# Patient Record
Sex: Female | Born: 1990
Health system: Southern US, Community
[De-identification: ages and names within clinical notes are randomized; demographics above are authoritative.]

## PROBLEM LIST (undated history)

## (undated) DIAGNOSIS — K08409 Partial loss of teeth, unspecified cause, unspecified class: Secondary | ICD-10-CM

## (undated) DIAGNOSIS — Z9889 Other specified postprocedural states: Secondary | ICD-10-CM

## (undated) DIAGNOSIS — Z8619 Personal history of other infectious and parasitic diseases: Secondary | ICD-10-CM

## (undated) DIAGNOSIS — D649 Anemia, unspecified: Secondary | ICD-10-CM

## (undated) DIAGNOSIS — I10 Essential (primary) hypertension: Secondary | ICD-10-CM

## (undated) DIAGNOSIS — Z8719 Personal history of other diseases of the digestive system: Secondary | ICD-10-CM

## (undated) HISTORY — DX: Personal history of other infectious and parasitic diseases: Z86.19

## (undated) HISTORY — DX: Morbid (severe) obesity due to excess calories: E66.01

---
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
## 2009-04-30 ENCOUNTER — Other Ambulatory Visit: Admission: RE | Admit: 2009-04-30 | Discharge: 2009-04-30 | Payer: Self-pay | Admitting: Family Medicine

## 2009-07-29 ENCOUNTER — Emergency Department (HOSPITAL_COMMUNITY): Admission: EM | Admit: 2009-07-29 | Discharge: 2009-07-29 | Payer: Self-pay | Admitting: Emergency Medicine

## 2010-02-26 ENCOUNTER — Inpatient Hospital Stay (HOSPITAL_COMMUNITY): Admission: AD | Admit: 2010-02-26 | Discharge: 2010-02-26 | Payer: Self-pay | Admitting: Obstetrics and Gynecology

## 2010-02-26 ENCOUNTER — Ambulatory Visit: Payer: Self-pay | Admitting: Physician Assistant

## 2010-02-27 ENCOUNTER — Inpatient Hospital Stay (HOSPITAL_COMMUNITY): Admission: AD | Admit: 2010-02-27 | Discharge: 2010-02-27 | Payer: Self-pay | Admitting: Obstetrics and Gynecology

## 2010-03-01 ENCOUNTER — Inpatient Hospital Stay (HOSPITAL_COMMUNITY): Admission: AD | Admit: 2010-03-01 | Discharge: 2010-03-02 | Payer: Self-pay | Admitting: Obstetrics & Gynecology

## 2010-03-01 ENCOUNTER — Ambulatory Visit: Payer: Self-pay | Admitting: Nurse Practitioner

## 2010-03-03 ENCOUNTER — Inpatient Hospital Stay (HOSPITAL_COMMUNITY): Admission: AD | Admit: 2010-03-03 | Discharge: 2010-03-03 | Payer: Self-pay | Admitting: Obstetrics and Gynecology

## 2010-03-03 ENCOUNTER — Ambulatory Visit: Payer: Self-pay | Admitting: Obstetrics and Gynecology

## 2010-07-03 ENCOUNTER — Inpatient Hospital Stay (HOSPITAL_COMMUNITY)
Admission: AD | Admit: 2010-07-03 | Discharge: 2010-07-03 | Payer: Self-pay | Source: Home / Self Care | Attending: Obstetrics and Gynecology | Admitting: Obstetrics and Gynecology

## 2010-07-12 ENCOUNTER — Inpatient Hospital Stay (HOSPITAL_COMMUNITY)
Admission: AD | Admit: 2010-07-12 | Discharge: 2010-07-12 | Payer: Self-pay | Source: Home / Self Care | Attending: Obstetrics and Gynecology | Admitting: Obstetrics and Gynecology

## 2010-07-12 DIAGNOSIS — O99891 Other specified diseases and conditions complicating pregnancy: Secondary | ICD-10-CM

## 2010-07-12 DIAGNOSIS — R109 Unspecified abdominal pain: Secondary | ICD-10-CM

## 2010-07-12 DIAGNOSIS — O9989 Other specified diseases and conditions complicating pregnancy, childbirth and the puerperium: Secondary | ICD-10-CM

## 2010-07-17 NOTE — L&D Delivery Note (Signed)
Delivery Note At 1:15 AM a viable female was delivered via  Spontaneous vaginal delivery).  APGAR:8/9 , ; weight . pending  Placenta status:intact with 3 vessel cord , .  Cord UJ:WJXB  Anesthesia:  epidural Episiotomy:none  Lacerations: none  Est. Blood Loss (mL): 400 cc  Mom to postpartum.  Baby to nursery-stable.  Gloria Chase S 02/18/2011, 1:24 AM

## 2010-07-29 LAB — HIV ANTIBODY (ROUTINE TESTING W REFLEX): HIV: NONREACTIVE

## 2010-07-29 LAB — RUBELLA ANTIBODY, IGM: Rubella: IMMUNE

## 2010-07-29 LAB — TYPE AND SCREEN: Antibody Screen: NEGATIVE

## 2010-07-29 LAB — STREP B DNA PROBE: GBS: POSITIVE

## 2010-07-29 LAB — SYPHILIS: RPR W/REFLEX TO RPR TITER AND TREPONEMAL ANTIBODIES, TRADITIONAL SCREENING AND DIAGNOSIS ALGORITHM: RPR: NONREACTIVE

## 2010-09-21 ENCOUNTER — Inpatient Hospital Stay (HOSPITAL_COMMUNITY)
Admission: AD | Admit: 2010-09-21 | Discharge: 2010-09-21 | Disposition: A | Discharge status: Home / Self Care | Payer: Medicaid Other | Source: Ambulatory Visit | Attending: Obstetrics and Gynecology | Admitting: Obstetrics and Gynecology

## 2010-09-21 DIAGNOSIS — O9989 Other specified diseases and conditions complicating pregnancy, childbirth and the puerperium: Secondary | ICD-10-CM

## 2010-09-21 DIAGNOSIS — E860 Dehydration: Secondary | ICD-10-CM

## 2010-09-21 DIAGNOSIS — R109 Unspecified abdominal pain: Secondary | ICD-10-CM | POA: Insufficient documentation

## 2010-09-21 DIAGNOSIS — O99891 Other specified diseases and conditions complicating pregnancy: Secondary | ICD-10-CM

## 2010-09-21 LAB — COMPREHENSIVE METABOLIC PANEL
ALT: 10 U/L (ref 0–35)
Alkaline Phosphatase: 54 U/L (ref 39–117)
BUN: 5 mg/dL — ABNORMAL LOW (ref 6–23)
CO2: 25 mEq/L (ref 19–32)
Calcium: 9 mg/dL (ref 8.4–10.5)
GFR calc non Af Amer: >60 mL/min (ref >60)
Glucose, Bld: 85 mg/dL (ref 70–99)
Sodium: 137 mEq/L (ref 135–145)
Total Protein: 6.5 g/dL (ref 6.0–8.3)

## 2010-09-21 LAB — CBC
HCT: 36.1 % (ref 36.0–46.0)
Hemoglobin: 11.5 g/dL — ABNORMAL LOW (ref 12.0–15.0)
MCHC: 31.9 g/dL (ref 30.0–36.0)
MCV: 84.9 fL (ref 78.0–100.0)
RDW: 13.8 % (ref 11.5–15.5)

## 2010-09-21 LAB — URINALYSIS, ROUTINE W REFLEX MICROSCOPIC
Ketones, ur: >80 mg/dL — AB
Nitrite: NEGATIVE
Protein, ur: NEGATIVE mg/dL
Urobilinogen, UA: 1 mg/dL (ref 0.0–1.0)

## 2010-09-21 LAB — WET PREP, GENITAL: Yeast Wet Prep HPF POC: NONE SEEN

## 2010-09-22 LAB — URINE CULTURE: Culture  Setup Time: 201203080120

## 2010-09-22 LAB — GC/CHLAMYDIA PROBE AMP, GENITAL
Chlamydia, DNA Probe: NEGATIVE
GC Probe Amp, Genital: NEGATIVE

## 2010-09-26 LAB — URINALYSIS, ROUTINE W REFLEX MICROSCOPIC
Bilirubin Urine: NEGATIVE
Glucose, UA: NEGATIVE mg/dL
Glucose, UA: NEGATIVE mg/dL
Hgb urine dipstick: NEGATIVE
Nitrite: NEGATIVE
Nitrite: NEGATIVE
Specific Gravity, Urine: 1.015 (ref 1.005–1.030)
Specific Gravity, Urine: >1.03 — ABNORMAL HIGH (ref 1.005–1.030)
pH: 6 (ref 5.0–8.0)
pH: 7 (ref 5.0–8.0)

## 2010-09-26 LAB — CBC
HCT: 39.3 % (ref 36.0–46.0)
Hemoglobin: 12.9 g/dL (ref 12.0–15.0)
Hemoglobin: 13.2 g/dL (ref 12.0–15.0)
MCH: 27.2 pg (ref 26.0–34.0)
Platelets: 265 10*3/uL (ref 150–400)
RBC: 4.74 MIL/uL (ref 3.87–5.11)
RBC: 4.91 MIL/uL (ref 3.87–5.11)
WBC: 7.4 10*3/uL (ref 4.0–10.5)

## 2010-09-26 LAB — WET PREP, GENITAL
Clue Cells Wet Prep HPF POC: NONE SEEN
Trich, Wet Prep: NONE SEEN
Trich, Wet Prep: NONE SEEN
Yeast Wet Prep HPF POC: NONE SEEN

## 2010-09-26 LAB — HCG, QUANTITATIVE, PREGNANCY
hCG, Beta Chain, Quant, S: 28145 m[IU]/mL — ABNORMAL HIGH (ref <5)
hCG, Beta Chain, Quant, S: 3772 m[IU]/mL — ABNORMAL HIGH (ref <5)

## 2010-09-26 LAB — POCT PREGNANCY, URINE
Preg Test, Ur: POSITIVE
Preg Test, Ur: POSITIVE

## 2010-09-26 LAB — GC/CHLAMYDIA PROBE AMP, GENITAL: Chlamydia, DNA Probe: NEGATIVE

## 2010-09-30 LAB — URINALYSIS, ROUTINE W REFLEX MICROSCOPIC
Bilirubin Urine: NEGATIVE
Glucose, UA: NEGATIVE mg/dL
Ketones, ur: 15 mg/dL — AB
Ketones, ur: NEGATIVE mg/dL
Leukocytes, UA: NEGATIVE
Nitrite: NEGATIVE
Protein, ur: 100 mg/dL — AB
pH: 6 (ref 5.0–8.0)

## 2010-09-30 LAB — ABO/RH: ABO/RH(D): B POS

## 2010-09-30 LAB — COMPREHENSIVE METABOLIC PANEL
ALT: 10 U/L (ref 0–35)
Alkaline Phosphatase: 58 U/L (ref 39–117)
CO2: 25 mEq/L (ref 19–32)
Calcium: 8.7 mg/dL (ref 8.4–10.5)
GFR calc non Af Amer: >60 mL/min (ref >60)
Glucose, Bld: 96 mg/dL (ref 70–99)
Potassium: 3.7 mEq/L (ref 3.5–5.1)
Sodium: 140 mEq/L (ref 135–145)

## 2010-09-30 LAB — CBC
Hemoglobin: 12.7 g/dL (ref 12.0–15.0)
MCHC: 32.4 g/dL (ref 30.0–36.0)
RBC: 4.66 MIL/uL (ref 3.87–5.11)

## 2010-09-30 LAB — GC/CHLAMYDIA PROBE AMP, GENITAL: GC Probe Amp, Genital: NEGATIVE

## 2010-09-30 LAB — WET PREP, GENITAL: Trich, Wet Prep: NONE SEEN

## 2010-09-30 LAB — URINE MICROSCOPIC-ADD ON

## 2010-09-30 LAB — POCT PREGNANCY, URINE: Preg Test, Ur: POSITIVE

## 2010-10-15 ENCOUNTER — Inpatient Hospital Stay (HOSPITAL_COMMUNITY)
Admission: AD | Admit: 2010-10-15 | Discharge: 2010-10-15 | Disposition: A | Discharge status: Home / Self Care | Payer: Medicaid Other | Source: Ambulatory Visit | Attending: Obstetrics and Gynecology | Admitting: Obstetrics and Gynecology

## 2010-10-15 DIAGNOSIS — R1032 Left lower quadrant pain: Secondary | ICD-10-CM

## 2010-10-15 DIAGNOSIS — A499 Bacterial infection, unspecified: Secondary | ICD-10-CM | POA: Insufficient documentation

## 2010-10-15 DIAGNOSIS — B9689 Other specified bacterial agents as the cause of diseases classified elsewhere: Secondary | ICD-10-CM | POA: Insufficient documentation

## 2010-10-15 DIAGNOSIS — O2390 Unspecified genitourinary tract infection in pregnancy, unspecified trimester: Secondary | ICD-10-CM

## 2010-10-15 DIAGNOSIS — N760 Acute vaginitis: Secondary | ICD-10-CM | POA: Insufficient documentation

## 2010-10-15 LAB — URINALYSIS, ROUTINE W REFLEX MICROSCOPIC
Glucose, UA: NEGATIVE mg/dL
Ketones, ur: 15 mg/dL — AB
Specific Gravity, Urine: 1.025 (ref 1.005–1.030)
pH: 6.5 (ref 5.0–8.0)

## 2010-10-15 LAB — WET PREP, GENITAL
Trich, Wet Prep: NONE SEEN
Yeast Wet Prep HPF POC: NONE SEEN

## 2010-12-16 ENCOUNTER — Inpatient Hospital Stay (HOSPITAL_COMMUNITY)
Admission: AD | Admit: 2010-12-16 | Discharge: 2010-12-16 | Disposition: A | Discharge status: Home / Self Care | Payer: Medicaid Other | Source: Ambulatory Visit | Attending: Obstetrics and Gynecology | Admitting: Obstetrics and Gynecology

## 2010-12-16 DIAGNOSIS — G43909 Migraine, unspecified, not intractable, without status migrainosus: Secondary | ICD-10-CM | POA: Insufficient documentation

## 2010-12-16 DIAGNOSIS — O4700 False labor before 37 completed weeks of gestation, unspecified trimester: Secondary | ICD-10-CM

## 2010-12-16 LAB — URINALYSIS, ROUTINE W REFLEX MICROSCOPIC
Bilirubin Urine: NEGATIVE
Glucose, UA: NEGATIVE mg/dL
Hgb urine dipstick: NEGATIVE
Ketones, ur: 15 mg/dL — AB
Protein, ur: NEGATIVE mg/dL

## 2011-01-11 DIAGNOSIS — Z9889 Other specified postprocedural states: Secondary | ICD-10-CM

## 2011-01-11 HISTORY — DX: Other specified postprocedural states: Z98.890

## 2011-02-17 ENCOUNTER — Inpatient Hospital Stay (HOSPITAL_COMMUNITY)
Admission: AD | Admit: 2011-02-17 | Discharge: 2011-02-19 | DRG: 775 | Disposition: A | Discharge status: Home / Self Care | Payer: Medicaid Other | Source: Ambulatory Visit | Attending: Obstetrics and Gynecology | Admitting: Obstetrics and Gynecology

## 2011-02-17 ENCOUNTER — Encounter (HOSPITAL_COMMUNITY): Payer: Self-pay | Admitting: Anesthesiology

## 2011-02-17 ENCOUNTER — Inpatient Hospital Stay (HOSPITAL_COMMUNITY): Payer: Medicaid Other | Admitting: Anesthesiology

## 2011-02-17 ENCOUNTER — Encounter (HOSPITAL_COMMUNITY): Payer: Self-pay | Admitting: *Deleted

## 2011-02-17 DIAGNOSIS — Z331 Pregnant state, incidental: Secondary | ICD-10-CM

## 2011-02-17 HISTORY — DX: Anemia, unspecified: D64.9

## 2011-02-17 HISTORY — DX: Personal history of other diseases of the digestive system: Z87.19

## 2011-02-17 HISTORY — DX: Other specified postprocedural states: Z98.890

## 2011-02-17 HISTORY — DX: Partial loss of teeth, unspecified cause, unspecified class: K08.409

## 2011-02-17 LAB — CBC
HCT: 35.1 % — ABNORMAL LOW (ref 36.0–46.0)
Hemoglobin: 11.3 g/dL — ABNORMAL LOW (ref 12.0–15.0)
MCH: 28.2 pg (ref 26.0–34.0)
MCHC: 32.2 g/dL (ref 30.0–36.0)
RBC: 4.01 MIL/uL (ref 3.87–5.11)

## 2011-02-17 MED ORDER — LACTATED RINGERS IV SOLN
500.0000 mL | Freq: Once | INTRAVENOUS | Status: AC
  Administered 2011-02-17: 1000 mL via INTRAVENOUS

## 2011-02-17 MED ORDER — OXYCODONE-ACETAMINOPHEN 5-325 MG PO TABS
2.0000 | ORAL_TABLET | ORAL | Status: DC | PRN
  Administered 2011-02-18: 2 via ORAL
  Filled 2011-02-17: qty 2

## 2011-02-17 MED ORDER — LACTATED RINGERS IV SOLN
INTRAVENOUS | Status: DC
  Administered 2011-02-17 (×2): via INTRAVENOUS

## 2011-02-17 MED ORDER — OXYTOCIN 20 UNITS IN LACTATED RINGERS INFUSION - SIMPLE
1.0000 m[IU]/min | INTRAVENOUS | Status: DC
  Administered 2011-02-17: 6 m[IU]/min via INTRAVENOUS

## 2011-02-17 MED ORDER — ONDANSETRON HCL 4 MG/2ML IJ SOLN
4.0000 mg | Freq: Four times a day (QID) | INTRAMUSCULAR | Status: DC | PRN
  Administered 2011-02-17: 4 mg via INTRAVENOUS
  Filled 2011-02-17: qty 2

## 2011-02-17 MED ORDER — IBUPROFEN 600 MG PO TABS: 600.0000 mg | ORAL_TABLET | Freq: Four times a day (QID) | ORAL | Status: DC | PRN

## 2011-02-17 MED ORDER — FENTANYL 2.5 MCG/ML BUPIVACAINE 1/10 % EPIDURAL INFUSION (WH - ANES)
14.0000 mL/h | INTRAMUSCULAR | Status: DC
  Administered 2011-02-17 (×2): 14 mL/h via EPIDURAL
  Filled 2011-02-17 (×2): qty 60

## 2011-02-17 MED ORDER — OXYTOCIN 20 UNITS IN LACTATED RINGERS INFUSION - SIMPLE
125.0000 mL/h | Freq: Once | INTRAVENOUS | Status: DC
  Administered 2011-02-18: 999 mL/h via INTRAVENOUS
  Filled 2011-02-17: qty 1000

## 2011-02-17 MED ORDER — NALBUPHINE SYRINGE 5 MG/0.5 ML
5.0000 mg | INJECTION | INTRAMUSCULAR | Status: DC | PRN
  Filled 2011-02-17: qty 0.5

## 2011-02-17 MED ORDER — EPHEDRINE 5 MG/ML INJ
10.0000 mg | INTRAVENOUS | Status: DC | PRN
  Filled 2011-02-17 (×2): qty 4

## 2011-02-17 MED ORDER — LIDOCAINE HCL 1.5 % IJ SOLN
INTRAMUSCULAR | Status: DC | PRN
  Administered 2011-02-17 (×2): 4 mL

## 2011-02-17 MED ORDER — PENICILLIN G POTASSIUM 5000000 UNITS IJ SOLR
2.5000 10*6.[IU] | INTRAVENOUS | Status: DC
  Administered 2011-02-17 (×2): 2.5 10*6.[IU] via INTRAVENOUS
  Filled 2011-02-17 (×6): qty 2.5

## 2011-02-17 MED ORDER — PHENYLEPHRINE 40 MCG/ML (10ML) SYRINGE FOR IV PUSH (FOR BLOOD PRESSURE SUPPORT)
80.0000 ug | PREFILLED_SYRINGE | INTRAVENOUS | Status: DC | PRN
  Filled 2011-02-17: qty 5

## 2011-02-17 MED ORDER — PHENYLEPHRINE 40 MCG/ML (10ML) SYRINGE FOR IV PUSH (FOR BLOOD PRESSURE SUPPORT)
80.0000 ug | PREFILLED_SYRINGE | INTRAVENOUS | Status: DC | PRN
  Filled 2011-02-17 (×2): qty 5

## 2011-02-17 MED ORDER — TERBUTALINE SULFATE 1 MG/ML IJ SOLN: 0.2500 mg | Freq: Once | INTRAMUSCULAR | Status: AC | PRN

## 2011-02-17 MED ORDER — LACTATED RINGERS IV SOLN
500.0000 mL | INTRAVENOUS | Status: DC | PRN
  Administered 2011-02-17 – 2011-02-18 (×2): 500 mL via INTRAVENOUS

## 2011-02-17 MED ORDER — LIDOCAINE HCL (PF) 1 % IJ SOLN
30.0000 mL | INTRAMUSCULAR | Status: DC | PRN
  Filled 2011-02-17 (×2): qty 30

## 2011-02-17 MED ORDER — CITRIC ACID-SODIUM CITRATE 334-500 MG/5ML PO SOLN: 30.0000 mL | ORAL | Status: DC | PRN

## 2011-02-17 MED ORDER — DIPHENHYDRAMINE HCL 50 MG/ML IJ SOLN: 12.5000 mg | INTRAMUSCULAR | Status: DC | PRN

## 2011-02-17 MED ORDER — EPHEDRINE 5 MG/ML INJ
10.0000 mg | INTRAVENOUS | Status: DC | PRN
  Filled 2011-02-17: qty 4

## 2011-02-17 MED ORDER — OXYTOCIN 20 UNITS IN LACTATED RINGERS INFUSION - SIMPLE
2.0000 m[IU]/min | INTRAVENOUS | Status: DC
  Administered 2011-02-17: 2 m[IU]/min via INTRAVENOUS

## 2011-02-17 MED ORDER — FLEET ENEMA 7-19 GM/118ML RE ENEM: 1.0000 | ENEMA | RECTAL | Status: DC | PRN

## 2011-02-17 MED ORDER — PENICILLIN G POTASSIUM 5000000 UNITS IJ SOLR
5.0000 10*6.[IU] | Freq: Once | INTRAVENOUS | Status: DC
  Administered 2011-02-17: 5 10*6.[IU] via INTRAVENOUS
  Filled 2011-02-17: qty 5

## 2011-02-17 MED ORDER — ACETAMINOPHEN 325 MG PO TABS
650.0000 mg | ORAL_TABLET | ORAL | Status: DC | PRN
  Administered 2011-02-18: 650 mg via ORAL
  Filled 2011-02-17: qty 2

## 2011-02-17 NOTE — Anesthesia Procedure Notes (Addendum)
Epidural Patient location during procedure: OB Start time: 02/17/2011 7:06 PM  Staffing Anesthesiologist: Keila Turan A. Performed by: anesthesiologist   Preanesthetic Checklist Completed: patient identified, site marked, surgical consent, pre-op evaluation, timeout performed, IV checked, risks and benefits discussed and monitors and equipment checked  Epidural Patient position: sitting Prep: site prepped and draped and DuraPrep Patient monitoring: continuous pulse ox and blood pressure Approach: midline Injection technique: LOR air  Needle:  Needle type: Tuohy  Needle gauge: 17 G Needle length: 9 cm Needle insertion depth: 6 cm Catheter type: closed end flexible Catheter size: 19 Gauge Catheter at skin depth: 11 cm Test dose: negative and 1.5% lidocaine  Assessment Events: blood not aspirated, injection not painful, no injection resistance, negative IV test and no paresthesia  Additional Notes Patient is more comfortable after epidural dosed. Please see RN's note for documentation of vital signs and FHR which are stable.

## 2011-02-17 NOTE — Progress Notes (Signed)
  Still with irreg ctx's  fhr with accel and good btb start pitocin risk disscussed

## 2011-02-17 NOTE — Anesthesia Preprocedure Evaluation (Signed)
Anesthesia Evaluation  Name, MR# and DOB Patient awake  General Assessment Comment  Reviewed: Allergy & Precautions, H&P  and Patient's Chart, lab work & pertinent test results  Airway Mallampati: III TM Distance: >3 FB Neck ROM: full    Dental No notable dental hx (+) Teeth Intact   Pulmonaryneg pulmonary ROS    clear to auscultation  pulmonary exam normal   Cardiovascular regular Normal   Neuro/PsychNegative Neurological ROS Negative Psych ROS  GI/Hepatic/Renal negative GI ROS, negative Liver ROS, and negative Renal ROS (+)       Endo/Other  Negative Endocrine ROS (+)   Abdominal   Musculoskeletal  Hematology negative hematology ROS (+)   Peds  Reproductive/Obstetrics (+) Pregnancy   Anesthesia Other Findings             Anesthesia Physical Anesthesia Plan  ASA: II  Anesthesia Plan: Epidural   Post-op Pain Management:    Induction:   Airway Management Planned:   Additional Equipment:   Intra-op Plan:   Post-operative Plan:   Informed Consent: I have reviewed the patients History and Physical, chart, labs and discussed the procedure including the risks, benefits and alternatives for the proposed anesthesia with the patient or authorized representative who has indicated his/her understanding and acceptance.     Plan Discussed with: Anesthesiologist  Anesthesia Plan Comments:         Anesthesia Quick Evaluation  

## 2011-02-17 NOTE — H&P (Signed)
Gloria Chase is a 20 y.o. female presenting with SROM AT 37.5 WEEKS Maternal Medical History:  Reason for admission: Reason for admission: rupture of membranes.  Contractions: Onset was 1-2 hours ago.   Frequency: irregular.   Duration is approximately 3 minutes.   Perceived severity is mild.    Fetal activity: Perceived fetal activity is normal.    Prenatal Complications - Diabetes: none.    OB History    Grav Para Term Preterm Abortions TAB SAB Ect Mult Living   2 1 1  0 0 0 0 0 0 1     History reviewed. No pertinent past medical history. No past surgical history on file. Family History: family history is not on file. Social History:  reports that she has never smoked. She does not have any smokeless tobacco history on file. She reports that she does not drink alcohol or use illicit drugs.  ROS    Blood pressure 127/75, pulse 94, temperature 98.8 F (37.1 C), temperature source Oral, height 5\' 5"  (1.651 m), weight 207 lb (93.895 kg). Maternal Exam:  Uterine Assessment: Contraction strength is moderate.  Contraction duration is 3 minutes. Contraction frequency is irregular.   Abdomen: Patient reports no abdominal tenderness. Fundal height is c/w dates.   Estimated fetal weight is 7.5 lbs.   Fetal presentation: vertex  Introitus: Normal vulva. Normal vagina.  Ferning test: not done.  Nitrazine test: not done. Amniotic fluid character: clear.  Pelvis: adequate for delivery.   Cervix: Cervix evaluated by digital exam.     Physical Exam  Cardiovascular: Normal rate, regular rhythm and normal heart sounds.   Respiratory: Breath sounds normal.  Neurological: She has normal reflexes.    Prenatal labs: ABO, Rh: B POS (12/27 1703) Antibody:   Rubella:   RPR:    HBsAg:    HIV:    GBS:     Assessment/Plan:RESENTS WITH SROM AT 37 + WEEKS.  ROUTINE L AND D.   Tashala Cumbo S 02/17/2011, 2:03 PM

## 2011-02-17 NOTE — Progress Notes (Signed)
  ON PITOCIN.  NOW 8-9 CM DILATION VTX AT 0 STATION.  SOME MILD VARIABLES WITH CTX'S BUT ACCEL WITH SCALP STIMULATION.  CUT PITOCIN TO AND POSITION CHANGE WILL FOLLOW.;

## 2011-02-17 NOTE — Progress Notes (Signed)
2 in office yesterday, "stripped membranes".  Ctx's started 0900, gush of clear fluid, continues to leak- started at approx 1300.  G2 P1, denies any complication with either preg.

## 2011-02-17 NOTE — Progress Notes (Signed)
Pt has at this time refused foley catheter.

## 2011-02-17 NOTE — Progress Notes (Signed)
  OF NOTE POST URINE FOR GROUP B STREPT  PROCEDE WITH PEN G

## 2011-02-18 ENCOUNTER — Encounter (HOSPITAL_COMMUNITY): Payer: Self-pay

## 2011-02-18 LAB — CBC
HCT: 28.3 % — ABNORMAL LOW (ref 36.0–46.0)
Hemoglobin: 9 g/dL — ABNORMAL LOW (ref 12.0–15.0)
MCH: 27.8 pg (ref 26.0–34.0)
MCHC: 31.8 g/dL (ref 30.0–36.0)
RDW: 13.8 % (ref 11.5–15.5)

## 2011-02-18 MED ORDER — SODIUM CHLORIDE 0.9 % IJ SOLN: 3.0000 mL | INTRAMUSCULAR | Status: DC | PRN

## 2011-02-18 MED ORDER — OXYTOCIN 20 UNITS IN LACTATED RINGERS INFUSION - SIMPLE: 125.0000 mL/h | INTRAVENOUS | Status: DC | PRN

## 2011-02-18 MED ORDER — SENNOSIDES-DOCUSATE SODIUM 8.6-50 MG PO TABS
2.0000 | ORAL_TABLET | Freq: Every day | ORAL | Status: DC
  Administered 2011-02-18: 2 via ORAL

## 2011-02-18 MED ORDER — IBUPROFEN 600 MG PO TABS
600.0000 mg | ORAL_TABLET | Freq: Four times a day (QID) | ORAL | Status: DC
  Administered 2011-02-18 – 2011-02-19 (×5): 600 mg via ORAL
  Filled 2011-02-18 (×5): qty 1

## 2011-02-18 MED ORDER — SIMETHICONE 80 MG PO CHEW: 80.0000 mg | CHEWABLE_TABLET | ORAL | Status: DC | PRN

## 2011-02-18 MED ORDER — DIBUCAINE 1 % RE OINT: 1.0000 "application " | TOPICAL_OINTMENT | RECTAL | Status: DC | PRN

## 2011-02-18 MED ORDER — SODIUM CHLORIDE 0.9 % IV SOLN: 250.0000 mL | INTRAVENOUS | Status: DC

## 2011-02-18 MED ORDER — FLEET ENEMA 7-19 GM/118ML RE ENEM: 1.0000 | ENEMA | RECTAL | Status: DC | PRN

## 2011-02-18 MED ORDER — DIPHENHYDRAMINE HCL 25 MG PO CAPS: 25.0000 mg | ORAL_CAPSULE | Freq: Four times a day (QID) | ORAL | Status: DC | PRN

## 2011-02-18 MED ORDER — LANOLIN HYDROUS EX OINT: TOPICAL_OINTMENT | CUTANEOUS | Status: DC | PRN

## 2011-02-18 MED ORDER — SODIUM CHLORIDE 0.9 % IV SOLN
3.0000 g | Freq: Four times a day (QID) | INTRAVENOUS | Status: DC
  Administered 2011-02-18: 3 g via INTRAVENOUS
  Filled 2011-02-18 (×3): qty 3

## 2011-02-18 MED ORDER — BENZOCAINE-MENTHOL 20-0.5 % EX AERO: 1.0000 "application " | INHALATION_SPRAY | CUTANEOUS | Status: DC | PRN

## 2011-02-18 MED ORDER — SODIUM CHLORIDE 0.9 % IJ SOLN
3.0000 mL | Freq: Two times a day (BID) | INTRAMUSCULAR | Status: DC
  Administered 2011-02-18: 3 mL via INTRAVENOUS

## 2011-02-18 MED ORDER — ONDANSETRON HCL 4 MG/2ML IJ SOLN: 4.0000 mg | INTRAMUSCULAR | Status: DC | PRN

## 2011-02-18 MED ORDER — ZOLPIDEM TARTRATE 5 MG PO TABS: 5.0000 mg | ORAL_TABLET | Freq: Every evening | ORAL | Status: DC | PRN

## 2011-02-18 MED ORDER — WITCH HAZEL-GLYCERIN EX PADS: 1.0000 "application " | MEDICATED_PAD | CUTANEOUS | Status: DC | PRN

## 2011-02-18 MED ORDER — ONDANSETRON HCL 4 MG PO TABS: 4.0000 mg | ORAL_TABLET | ORAL | Status: DC | PRN

## 2011-02-18 MED ORDER — PRENATAL PLUS 27-1 MG PO TABS
1.0000 | ORAL_TABLET | Freq: Every day | ORAL | Status: DC
  Administered 2011-02-18: 1 via ORAL
  Filled 2011-02-18: qty 1

## 2011-02-18 MED ORDER — OXYCODONE-ACETAMINOPHEN 5-325 MG PO TABS
1.0000 | ORAL_TABLET | ORAL | Status: DC | PRN
  Administered 2011-02-18 – 2011-02-19 (×3): 1 via ORAL
  Filled 2011-02-18 (×3): qty 1

## 2011-02-18 MED ORDER — BISACODYL 10 MG RE SUPP: 10.0000 mg | Freq: Every day | RECTAL | Status: DC | PRN

## 2011-02-18 NOTE — Progress Notes (Signed)
Post Partum Day zero Subjective: no complaints  Objective: Blood pressure 84/51, pulse 70, temperature 98 F (36.7 C), temperature source Oral, resp. rate 20, height 5\' 5"  (1.651 m), weight 207 lb (93.895 kg), SpO2 93.00%, unknown if currently breastfeeding.  Physical Exam:  General: alert Lochia: appropriate Uterine Fundus: firm  DVT Evaluation: No evidence of DVT seen on physical exam.   Basename 02/18/11 0525 02/17/11 1340  HGB 9.0* 11.3*  HCT 28.3* 35.1*    Assessment/Plan: Plan for discharge tomorrow   LOS: 1 day   Gloria Chase S 02/18/2011, 9:23 AM

## 2011-02-18 NOTE — Progress Notes (Signed)
Baby sleepy and a little gaggy at this attempt. Mom reports that baby nursed well earlier today. No questions at present.  Experienced BF mom- nursed last baby over a year. To page for assist prn

## 2011-02-18 NOTE — Anesthesia Postprocedure Evaluation (Signed)
  Anesthesia Post-op Note  Patient: Gloria Chase  Procedure(s) Performed: * Lumbar Epidural for L & D *  Patient Location: Labor and Delivery  Anesthesia Type: Epidural  Level of Consciousness: awake, alert  and oriented  Airway and Oxygen Therapy: Patient Spontanous Breathing  Post-op Pain: none  Post-op Assessment: Post-op Vital signs reviewed, Patient's Cardiovascular Status Stable, Respiratory Function Stable, Patent Airway, No signs of Nausea or vomiting, Pain level controlled, No headache, No backache, No residual numbness and No residual motor weakness  Post-op Vital Signs: Reviewed and stable  Complications: No apparent anesthesia complications

## 2011-02-19 MED ORDER — OXYCODONE-ACETAMINOPHEN 5-325 MG PO TABS: 1.0000 | ORAL_TABLET | Freq: Four times a day (QID) | ORAL | Status: AC | PRN

## 2011-02-19 NOTE — Progress Notes (Signed)
Mom reports that BF is going well, no questions at present. To call prn

## 2011-02-19 NOTE — Discharge Summary (Signed)
Obstetric Discharge Summary Reason for Admission: onset of labor Prenatal Procedures: none Intrapartum Procedures: spontaneous vaginal delivery Postpartum Procedures: none Complications-Operative and Postpartum: none Hemoglobin  Date Value Range Status  02/18/2011 9.0* 12.0-15.0 (g/dL) Final     DELTA CHECK NOTED     REPEATED TO VERIFY     HCT  Date Value Range Status  02/18/2011 28.3* 36.0-46.0 (%) Final    Discharge Diagnoses: Term Pregnancy-delivered  Discharge Information: Date: 02/19/2011 Activity: pelvic rest Diet: routine Medications: Percocet Condition: stable Instructions: refer to practice specific booklet Discharge to: home   Newborn Data: Live born female  Birth Weight: 9 lb 1.7 oz (4131 g) APGAR: 8, 9  Home with mother.  Gloria Chase 02/19/2011, 8:48 AM

## 2011-02-19 NOTE — Progress Notes (Signed)
Post Partum Day one Subjective: no complaints  Objective: Blood pressure 110/64, pulse 74, temperature 98.5 F (36.9 C), temperature source Oral, resp. rate 18, height 5\' 5"  (1.651 m), weight 207 lb (93.895 kg), SpO2 93.00%, unknown if currently breastfeeding.  Physical Exam:  General: alert Lochia: appropriate Uterine Fundus: firm Incision: none* DVT Evaluation: No evidence of DVT seen on physical exam.   Basename 02/18/11 0525 02/17/11 1340  HGB 9.0* 11.3*  HCT 28.3* 35.1*    Assessment/Plan: Discharge home   LOS: 2 days   Celsey Asselin S 02/19/2011, 8:47 AM

## 2011-02-20 NOTE — Progress Notes (Signed)
UR chart review completed.  

## 2011-02-23 ENCOUNTER — Encounter (HOSPITAL_COMMUNITY): Payer: Self-pay | Admitting: *Deleted

## 2011-02-23 ENCOUNTER — Inpatient Hospital Stay (HOSPITAL_COMMUNITY)
Admission: AD | Admit: 2011-02-23 | Discharge: 2011-02-23 | Disposition: A | Discharge status: Home / Self Care | Payer: Medicaid Other | Source: Ambulatory Visit | Attending: Obstetrics and Gynecology | Admitting: Obstetrics and Gynecology

## 2011-02-23 DIAGNOSIS — G43909 Migraine, unspecified, not intractable, without status migrainosus: Secondary | ICD-10-CM | POA: Insufficient documentation

## 2011-02-23 LAB — COMPREHENSIVE METABOLIC PANEL
Albumin: 2.6 g/dL — ABNORMAL LOW (ref 3.5–5.2)
BUN: 8 mg/dL (ref 6–23)
Calcium: 9.1 mg/dL (ref 8.4–10.5)
Creatinine, Ser: 0.73 mg/dL (ref 0.50–1.10)
GFR calc Af Amer: >60 mL/min (ref >60)
Glucose, Bld: 91 mg/dL (ref 70–99)
Total Protein: 6.5 g/dL (ref 6.0–8.3)

## 2011-02-23 LAB — URINALYSIS, ROUTINE W REFLEX MICROSCOPIC
Ketones, ur: NEGATIVE mg/dL
Nitrite: NEGATIVE
Specific Gravity, Urine: 1.01 (ref 1.005–1.030)
pH: 7.5 (ref 5.0–8.0)

## 2011-02-23 LAB — URIC ACID: Uric Acid, Serum: 6.4 mg/dL (ref 2.4–7.0)

## 2011-02-23 LAB — CBC
HCT: 32.1 % — ABNORMAL LOW (ref 36.0–46.0)
Hemoglobin: 9.9 g/dL — ABNORMAL LOW (ref 12.0–15.0)
MCHC: 30.8 g/dL (ref 30.0–36.0)
RBC: 3.64 MIL/uL — ABNORMAL LOW (ref 3.87–5.11)
WBC: 7 10*3/uL (ref 4.0–10.5)

## 2011-02-23 LAB — LACTATE DEHYDROGENASE: LDH: 312 U/L — ABNORMAL HIGH (ref 94–250)

## 2011-02-23 LAB — URINE MICROSCOPIC-ADD ON

## 2011-02-23 MED ORDER — HYDROCODONE-ACETAMINOPHEN 5-325 MG PO TABS
2.0000 | ORAL_TABLET | Freq: Once | ORAL | Status: AC
  Administered 2011-02-23: 2 via ORAL
  Filled 2011-02-23: qty 2

## 2011-02-23 NOTE — ED Provider Notes (Signed)
Gloria Chase is a 20 y.o. AA female who was evaluated by Dr. Arelia Sneddon in the office today and sent to [facility] for lab work. She has had a "migrane type headache" that gets as bad as 10/10 but now is 4/10. She is stable at this time and in no acute distress.     [place], [place] 02/23/11 1759

## 2011-02-23 NOTE — Progress Notes (Addendum)
Pt reports having   A headache/migraine in the right side of her head  that started 2 days ago and has not let up. Had taken percocet without relief. Light sensitive at times. Also reporting upper right abd pain/tightness that readiates towads her back . Pt is post partum 02/18/11

## 2011-04-27 LAB — CBC
HCT: 29.9 — ABNORMAL LOW
Platelets: 195
RDW: 14.8 — ABNORMAL HIGH

## 2011-04-28 LAB — CBC
HCT: 38.2
Hemoglobin: 12.9
MCHC: 33.7
MCV: 87.3
Platelets: 248
RBC: 4.37
RDW: 14.8 — ABNORMAL HIGH
WBC: 9.8

## 2012-03-22 ENCOUNTER — Emergency Department (HOSPITAL_COMMUNITY)
Admission: EM | Admit: 2012-03-22 | Discharge: 2012-03-23 | Disposition: A | Discharge status: Home / Self Care | Payer: Managed Care, Other (non HMO) | Attending: Emergency Medicine | Admitting: Emergency Medicine

## 2012-03-22 ENCOUNTER — Emergency Department (HOSPITAL_COMMUNITY): Payer: Managed Care, Other (non HMO)

## 2012-03-22 ENCOUNTER — Encounter (HOSPITAL_COMMUNITY): Payer: Self-pay | Admitting: *Deleted

## 2012-03-22 DIAGNOSIS — R079 Chest pain, unspecified: Secondary | ICD-10-CM | POA: Insufficient documentation

## 2012-03-22 DIAGNOSIS — J029 Acute pharyngitis, unspecified: Secondary | ICD-10-CM | POA: Insufficient documentation

## 2012-03-22 DIAGNOSIS — I10 Essential (primary) hypertension: Secondary | ICD-10-CM | POA: Insufficient documentation

## 2012-03-22 HISTORY — DX: Essential (primary) hypertension: I10

## 2012-03-22 LAB — RAPID STREP SCREEN (MED CTR MEBANE ONLY): Streptococcus, Group A Screen (Direct): NEGATIVE

## 2012-03-22 MED ORDER — ACETAMINOPHEN 325 MG PO TABS
ORAL_TABLET | ORAL | Status: AC
  Filled 2012-03-22: qty 2

## 2012-03-22 MED ORDER — ACETAMINOPHEN 325 MG PO TABS
650.0000 mg | ORAL_TABLET | Freq: Once | ORAL | Status: AC
  Administered 2012-03-22: 650 mg via ORAL

## 2012-03-22 NOTE — ED Notes (Signed)
Pt states that she has been having a sore throat for 2 days. States that it has gotten progressively worse since yesterday. Taking only halls cough drops. States that she has not been feeling well with fever and chills. Went to [facility] today and noticed that her bp was 141/101.

## 2012-03-22 NOTE — ED Provider Notes (Signed)
History     CSN: [id number]  Arrival date & time 03/22/12  1901   First MD Initiated Contact with Patient 03/22/12 2048      Chief Complaint  Patient presents with  . Hypertension  . Sore Throat    (Consider location/radiation/quality/duration/timing/severity/associated sxs/prior treatment) HPI Comments: Patient presents with sore throat, fever, chest pain and cough for the past 2 days. She's sick contacts at home. She denies any nausea, vomiting or abdominal pain. She did not check her temperature at home. She denies any change in by mouth intake or urine output. She denies any rash. She's not take anything except cough drops.  The history is provided by the patient.    Past Medical History  Diagnosis Date  . Anemia   . Hx of hernia repair 2000  . History of wisdom tooth extraction 2005  . History of colposcopy with cervical biopsy 01/11/2011  . Hypertension     Past Surgical History  Procedure Date  . No past surgeries     History reviewed. No pertinent family history.  History  Substance Use Topics  . Smoking status: Never Smoker   . Smokeless tobacco: Not on file  . Alcohol Use: No    OB History    Grav Para Term Preterm Abortions TAB SAB Ect Mult Living   4 2 2  0 2 1 0 1 0 2      Review of Systems  Constitutional: Positive for fever, activity change and appetite change.  HENT: Positive for congestion, sore throat and rhinorrhea.   Respiratory: Positive for cough and shortness of breath. Negative for chest tightness.   Cardiovascular: Negative for chest pain.  Gastrointestinal: Negative for nausea and vomiting.  Genitourinary: Negative for dysuria, vaginal bleeding and vaginal discharge.  Musculoskeletal: Negative for back pain.  Skin: Negative for rash.  Neurological: Positive for weakness. Negative for dizziness and headaches.    Allergies  Review of patient's allergies indicates no known allergies.  Home Medications  No current outpatient  prescriptions on file.  BP 147/87  Pulse 56  Temp 101.3 F (38.5 C) (Oral)  Resp 16  SpO2 99%  LMP 03/14/2012  Physical Exam  Constitutional: She is oriented to person, place, and time. She appears well-developed and well-nourished. No distress.  HENT:  Head: Normocephalic and atraumatic.  Mouth/Throat: No oropharyngeal exudate.       Oropharyngeal erythema without exudate  Eyes: Conjunctivae and EOM are normal. Pupils are equal, round, and reactive to light.  Neck: Normal range of motion. Neck supple.  Cardiovascular: Normal rate, regular rhythm and normal heart sounds.   No murmur heard. Pulmonary/Chest: Effort normal and breath sounds normal. No respiratory distress.  Abdominal: Soft. There is no tenderness. There is no rebound and no guarding.  Musculoskeletal: Normal range of motion. She exhibits no edema and no tenderness.  Lymphadenopathy:    She has cervical adenopathy.  Neurological: She is alert and oriented to person, place, and time. No cranial nerve deficit.  Skin: Skin is warm.    ED Course  Procedures (including critical care time)  Labs Reviewed  D-DIMER, QUANTITATIVE - Abnormal; Notable for the following:    D-Dimer, Quant 0.56 (*)     All other components within normal limits  RAPID STREP SCREEN   Dg Chest 2 View  03/22/2012  *RADIOLOGY REPORT*  Clinical Data: Fever and cough.  Chest pain.  CHEST - 2 VIEW  Comparison:  None.  Findings:  The heart size and mediastinal contours are  within normal limits.  Both lungs are clear.  The visualized skeletal structures are unremarkable.  IMPRESSION: No active cardiopulmonary disease.   Original Report Authenticated By: Danae Orleans, M.D.      No diagnosis found.    MDM  Cough, sore throat, fever, chest pain. History of hormone use.  Chest x-ray negative. Rapid strep negative. Likely viral pharyngitis.   Date: 03/22/2012  Rate: 90  Rhythm: normal sinus rhythm  QRS Axis: normal  Intervals: normal  ST/T  Wave abnormalities: normal  Conduction Disutrbances:none  Narrative Interpretation: PVCs  Old EKG Reviewed: unchanged       Glynn Octave, MD 03/23/12 0100

## 2012-03-23 MED ORDER — IOHEXOL 350 MG/ML SOLN
100.0000 mL | Freq: Once | INTRAVENOUS | Status: AC | PRN
  Administered 2012-03-23: 100 mL via INTRAVENOUS

## 2012-12-07 IMAGING — CT CT ANGIO CHEST
1 of 2 series · 20 of 32 positions shown · IV contrast (OMNIPAQUE 300)
Comparison: None.

CLINICAL DATA: Chest pain.  Elevated D-dimer.

CT ANGIOGRAPHY CHEST
TECHNIQUE: Multidetector CT imaging of the chest using the
standard protocol during bolus administration of intravenous
contrast. Multiplanar reconstructed images including MIPs were
obtained and reviewed to evaluate the vascular anatomy.
Contrast: 100mL OMNIPAQUE IOHEXOL 350 MG/ML SOLN

[Series 5: thins for pacs · axial · 0.63mm/px · z∈[-273,-58]mm · 20 of 237 slices shown]
[im 11/237  lung]
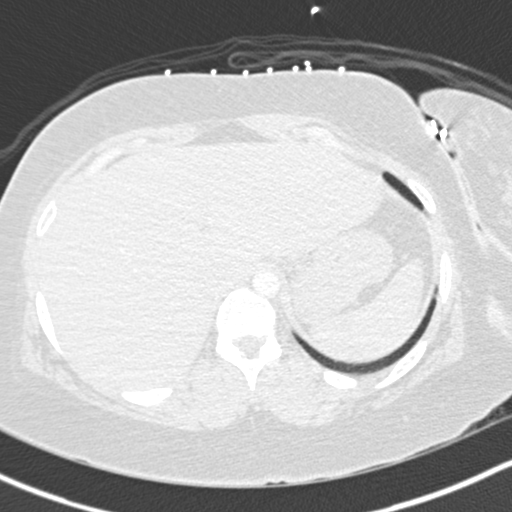
[im 21/237  soft-tissue]
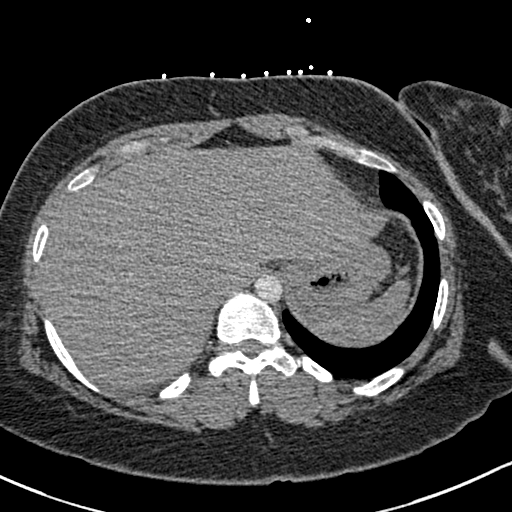
[im 31/237  lung]
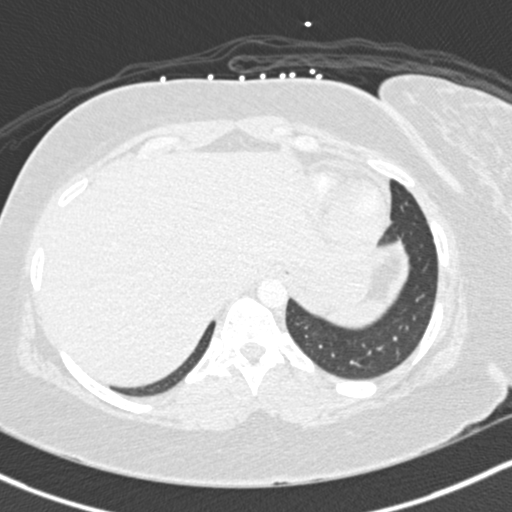
[im 42/237  soft-tissue]
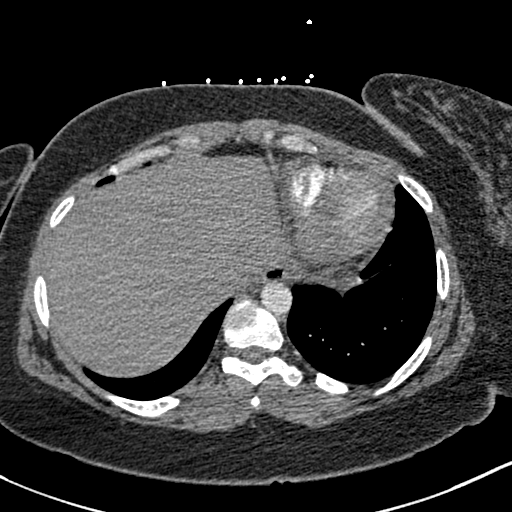
[im 52/237  lung]
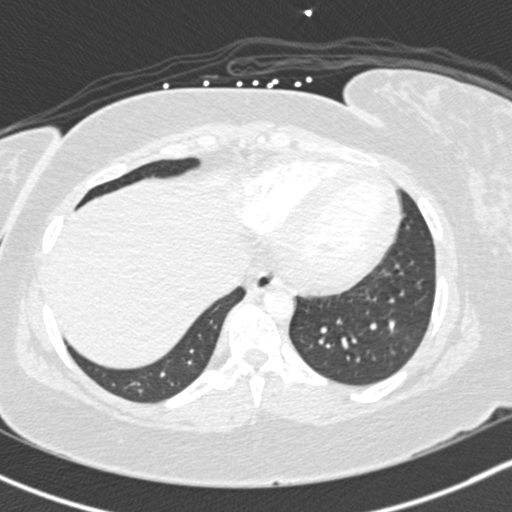
[im 72/237  soft-tissue]
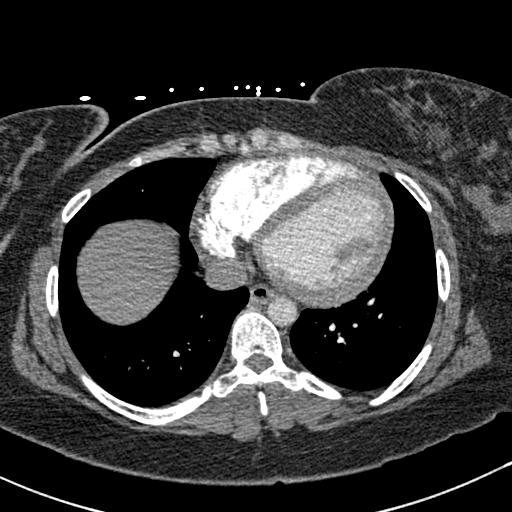
[im 83/237  lung]
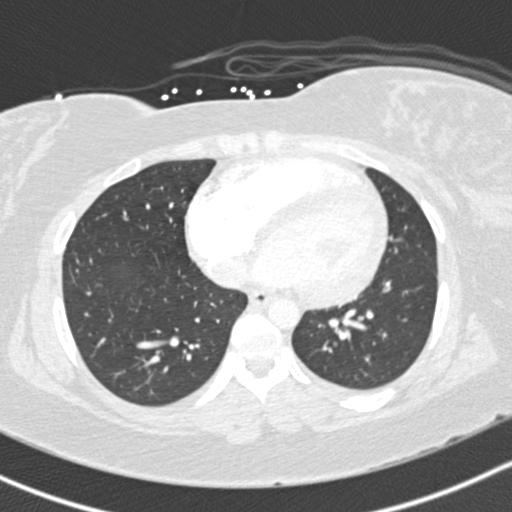
[im 93/237  soft-tissue]
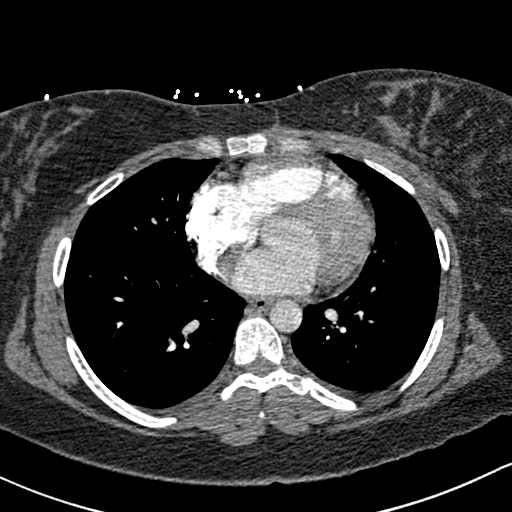
[im 103/237  lung]
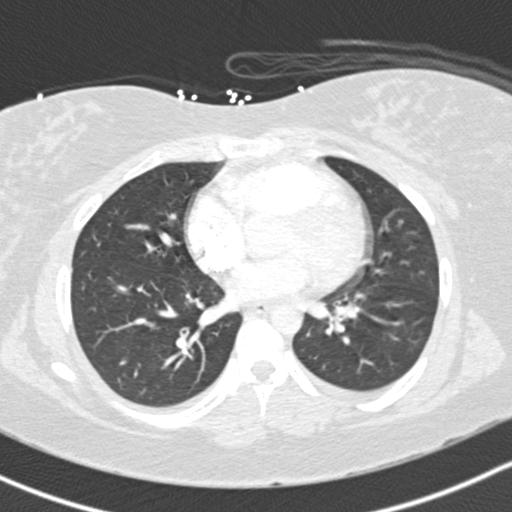
[im 113/237  soft-tissue]
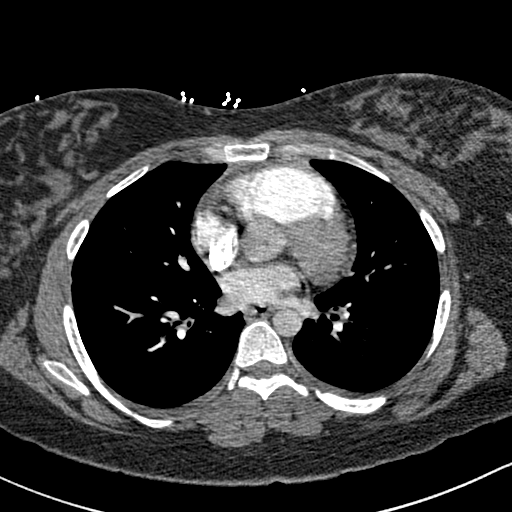
[im 124/237  lung]
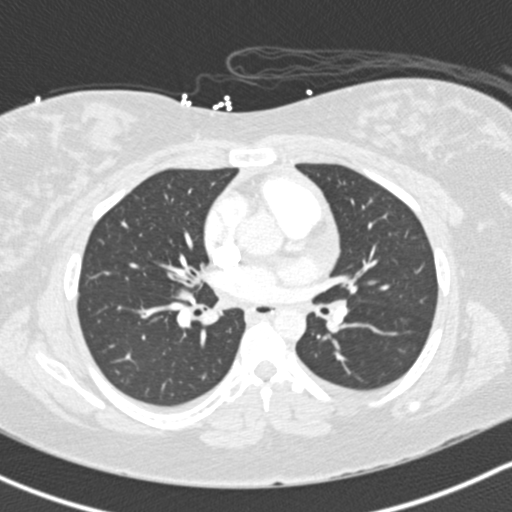
[im 134/237  soft-tissue]
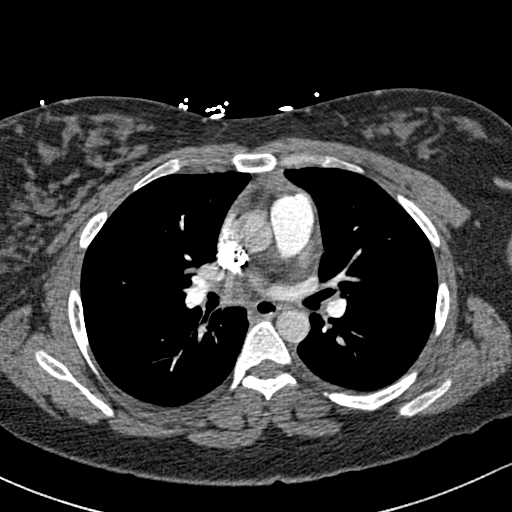
[im 144/237  lung]
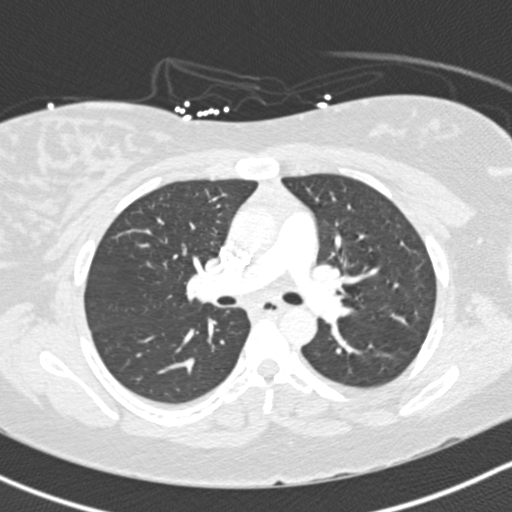
[im 154/237  soft-tissue]
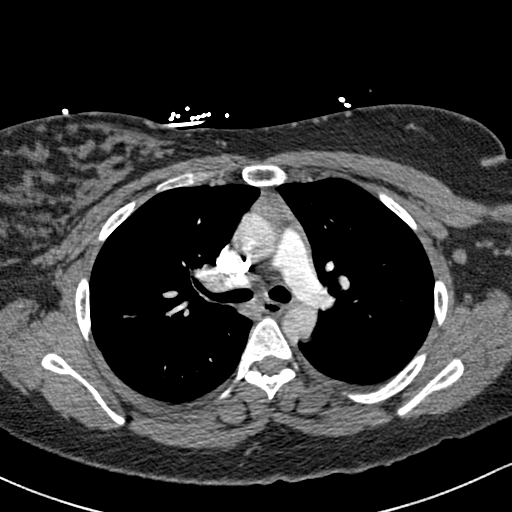
[im 165/237  lung]
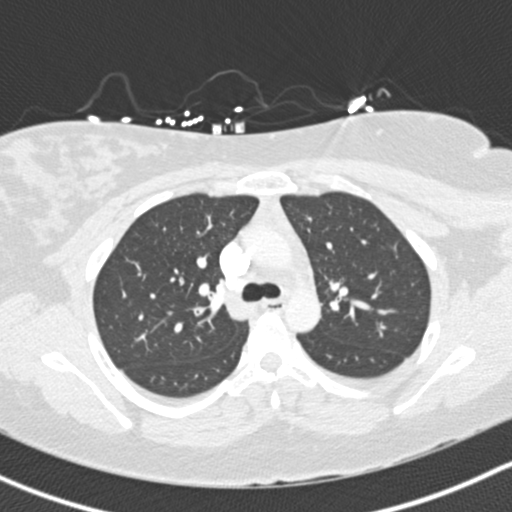
[im 185/237  soft-tissue]
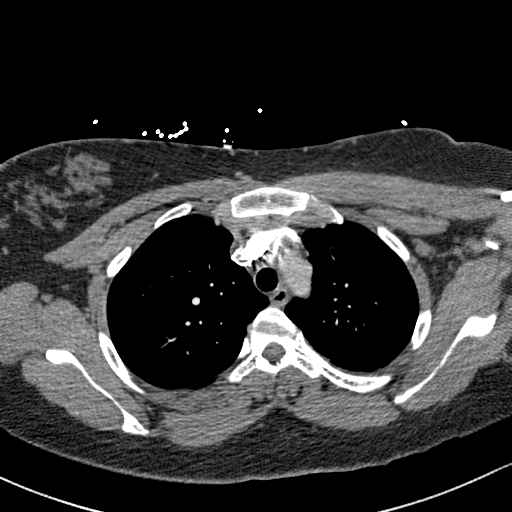
[im 195/237  lung]
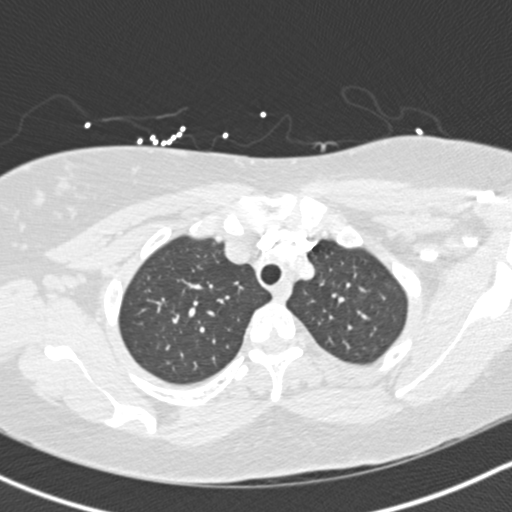
[im 206/237  soft-tissue]
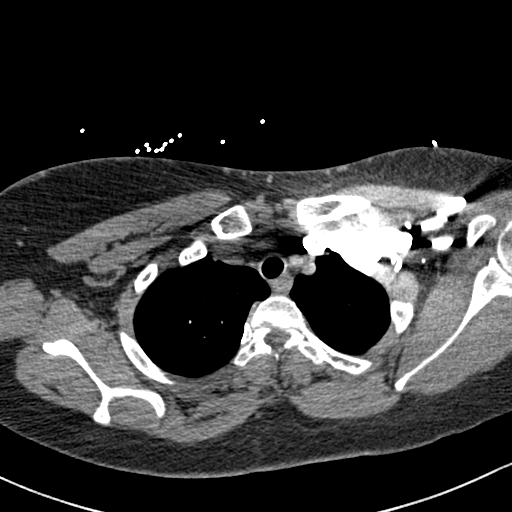
[im 216/237  lung]
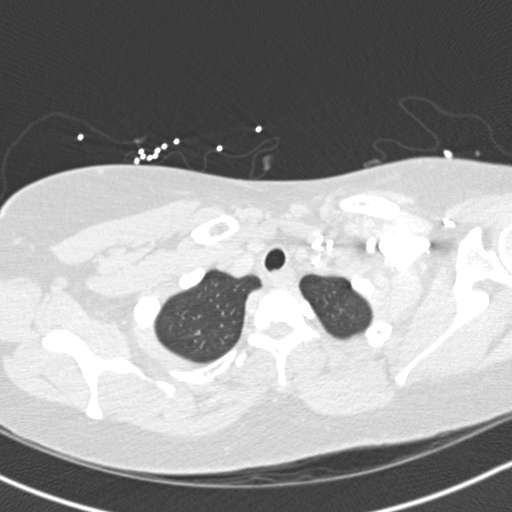
[im 226/237  soft-tissue]
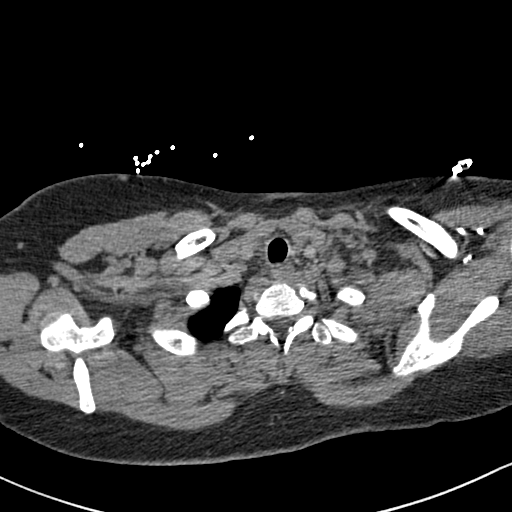

[20 of 32 positions shown; findings below may reference images not displayed]

FINDINGS: Satisfactory opacification of the pulmonary arteries
noted, and there is no evidence of pulmonary emboli.  No evidence
of thoracic aortic aneurysm or dissection.  No evidence of
mediastinal hematoma or mass.  No lymphadenopathy identified within
the thorax.

No evidence of pleural or pericardial effusion.  Both lungs are
clear.  No central endobronchial lesion identified.
IMPRESSION: Negative.  No evidence of pulmonary embolism or other disease.

## 2013-07-19 ENCOUNTER — Encounter (HOSPITAL_COMMUNITY): Payer: Self-pay | Admitting: Emergency Medicine

## 2013-07-19 ENCOUNTER — Emergency Department (HOSPITAL_COMMUNITY): Payer: Managed Care, Other (non HMO)

## 2013-07-19 ENCOUNTER — Emergency Department (HOSPITAL_COMMUNITY)
Admission: EM | Admit: 2013-07-19 | Discharge: 2013-07-19 | Disposition: A | Discharge status: Home / Self Care | Payer: Managed Care, Other (non HMO) | Attending: Emergency Medicine | Admitting: Emergency Medicine

## 2013-07-19 DIAGNOSIS — R05 Cough: Secondary | ICD-10-CM | POA: Insufficient documentation

## 2013-07-19 DIAGNOSIS — Z9889 Other specified postprocedural states: Secondary | ICD-10-CM | POA: Insufficient documentation

## 2013-07-19 DIAGNOSIS — J3489 Other specified disorders of nose and nasal sinuses: Secondary | ICD-10-CM | POA: Insufficient documentation

## 2013-07-19 DIAGNOSIS — R197 Diarrhea, unspecified: Secondary | ICD-10-CM | POA: Insufficient documentation

## 2013-07-19 DIAGNOSIS — J111 Influenza due to unidentified influenza virus with other respiratory manifestations: Secondary | ICD-10-CM

## 2013-07-19 DIAGNOSIS — R1110 Vomiting, unspecified: Secondary | ICD-10-CM | POA: Insufficient documentation

## 2013-07-19 DIAGNOSIS — R51 Headache: Secondary | ICD-10-CM | POA: Insufficient documentation

## 2013-07-19 DIAGNOSIS — R69 Illness, unspecified: Secondary | ICD-10-CM

## 2013-07-19 DIAGNOSIS — Z3202 Encounter for pregnancy test, result negative: Secondary | ICD-10-CM | POA: Insufficient documentation

## 2013-07-19 DIAGNOSIS — R6883 Chills (without fever): Secondary | ICD-10-CM | POA: Insufficient documentation

## 2013-07-19 DIAGNOSIS — D649 Anemia, unspecified: Secondary | ICD-10-CM | POA: Insufficient documentation

## 2013-07-19 DIAGNOSIS — Z8719 Personal history of other diseases of the digestive system: Secondary | ICD-10-CM | POA: Insufficient documentation

## 2013-07-19 DIAGNOSIS — IMO0001 Reserved for inherently not codable concepts without codable children: Secondary | ICD-10-CM | POA: Insufficient documentation

## 2013-07-19 DIAGNOSIS — R059 Cough, unspecified: Secondary | ICD-10-CM | POA: Insufficient documentation

## 2013-07-19 DIAGNOSIS — I10 Essential (primary) hypertension: Secondary | ICD-10-CM | POA: Insufficient documentation

## 2013-07-19 DIAGNOSIS — H53149 Visual discomfort, unspecified: Secondary | ICD-10-CM | POA: Insufficient documentation

## 2013-07-19 LAB — URINALYSIS, ROUTINE W REFLEX MICROSCOPIC
BILIRUBIN URINE: NEGATIVE
Glucose, UA: NEGATIVE mg/dL
Hgb urine dipstick: NEGATIVE
KETONES UR: 15 mg/dL — AB
LEUKOCYTES UA: NEGATIVE
NITRITE: NEGATIVE
PH: 7 (ref 5.0–8.0)
PROTEIN: NEGATIVE mg/dL
Specific Gravity, Urine: 1.006 (ref 1.005–1.030)
Urobilinogen, UA: 0.2 mg/dL (ref 0.0–1.0)

## 2013-07-19 LAB — POCT PREGNANCY, URINE: Preg Test, Ur: NEGATIVE

## 2013-07-19 MED ORDER — OSELTAMIVIR PHOSPHATE 75 MG PO CAPS: 75.0000 mg | ORAL_CAPSULE | Freq: Two times a day (BID) | ORAL | Status: DC

## 2013-07-19 MED ORDER — HYDROCODONE-ACETAMINOPHEN 5-325 MG PO TABS
2.0000 | ORAL_TABLET | Freq: Once | ORAL | Status: AC
  Administered 2013-07-19: 2 via ORAL
  Filled 2013-07-19: qty 2

## 2013-07-19 MED ORDER — HYDROCODONE-ACETAMINOPHEN 5-325 MG PO TABS: 1.0000 | ORAL_TABLET | ORAL | Status: DC | PRN

## 2013-07-19 NOTE — ED Provider Notes (Signed)
Medical screening examination/treatment/procedure(s) were performed by non-physician practitioner and as supervising physician I was immediately available for consultation/collaboration.  EKG Interpretation   None         Gwyneth SproutWhitney Tauriel Scronce, MD 07/19/13 1531

## 2013-07-19 NOTE — ED Notes (Signed)
Reports gi virus, then onset yesterday of headache, chills, abd pain, body aches. Mask on pt at triage.

## 2013-07-19 NOTE — ED Provider Notes (Signed)
CSN: [id number]     Arrival date & time 07/19/13  1205 History   First MD Initiated Contact with Patient 07/19/13 1243     Chief Complaint  Patient presents with  . Influenza   (Consider location/radiation/quality/duration/timing/severity/associated sxs/prior Treatment) HPI Comments: Pt states that she had vomiting and diarrhea about 1 week ago and she was fine and then yesterday she started with headache, chills and myalgias. Pt has not taken anything for the symptoms  Patient is a 23 y.o. female presenting with flu symptoms. The history is provided by the patient. No language interpreter was used.  Influenza Presenting symptoms: cough and headache   Severity:  Moderate Onset quality:  Sudden Duration:  1 day Progression:  Unchanged Chronicity:  New Relieved by:  Nothing Associated symptoms: chills and nasal congestion   Associated symptoms: no neck stiffness     Past Medical History  Diagnosis Date  . Anemia   . Hx of hernia repair 2000  . History of wisdom tooth extraction 2005  . History of colposcopy with cervical biopsy 01/11/2011  . Hypertension    Past Surgical History  Procedure Laterality Date  . No past surgeries     History reviewed. No pertinent family history. History  Substance Use Topics  . Smoking status: Never Smoker   . Smokeless tobacco: Not on file  . Alcohol Use: No   OB History   Grav Para Term Preterm Abortions TAB SAB Ect Mult Living   4 2 2  0 2 1 0 1 0 2     Review of Systems  Constitutional: Positive for chills.  HENT: Positive for congestion.   Eyes: Positive for photophobia.  Respiratory: Positive for cough.   Cardiovascular: Negative.   Musculoskeletal: Negative for neck stiffness.  Neurological: Positive for headaches.    Allergies  Review of patient's allergies indicates no known allergies.  Home Medications  No current outpatient prescriptions on file. BP 144/90  Pulse 66  Temp(Src) 99.8 F (37.7 C) (Oral)  Resp 16  Wt  226 lb 9.6 oz (102.785 kg)  SpO2 98%  LMP 07/05/2013 Physical Exam  Constitutional: She is oriented to person, place, and time. She appears well-developed and well-nourished.  HENT:  Right Ear: External ear normal.  Left Ear: External ear normal.  Nose: Rhinorrhea present.  Mouth/Throat: Posterior oropharyngeal erythema present.  Eyes: Conjunctivae and EOM are normal. Pupils are equal, round, and reactive to light.  Neck: Normal range of motion. Neck supple.  Cardiovascular: Normal rate and regular rhythm.   Pulmonary/Chest: Effort normal and breath sounds normal.  Abdominal: Soft. Bowel sounds are normal. There is no tenderness.  Musculoskeletal: Normal range of motion.  Neurological: She is alert and oriented to person, place, and time.  Skin: Skin is warm and dry.  Psychiatric: She has a normal mood and affect.    ED Course  Procedures (including critical care time) Labs Review Labs Reviewed  URINALYSIS, ROUTINE W REFLEX MICROSCOPIC - Abnormal; Notable for the following:    Ketones, ur 15 (*)    All other components within normal limits  POCT PREGNANCY, URINE   Imaging Review Dg Chest 2 View  07/19/2013   CLINICAL DATA:  Fever, cough  EXAM: CHEST  2 VIEW  COMPARISON:  CT ANGIO CHEST W/CM &/OR WO/CM dated 03/23/2012  FINDINGS: The heart size and mediastinal contours are within normal limits. Both lungs are clear. The visualized skeletal structures are unremarkable.  IMPRESSION: No active cardiopulmonary disease.   Electronically Signed  ByElige Ko   On: 07/19/2013 14:39    EKG Interpretation   None       MDM   1. Influenza-like illness    Pt is feeling better at this time:will send home with tamiflu and vicodin:no pneumonia noted on x-ray:pt is tolerating po here    Teressa Lower, NP 07/19/13 1513

## 2013-07-19 NOTE — Discharge Instructions (Signed)
As discussed you can take tamiflu as discussed. Viral Infections A viral infection can be caused by different types of viruses.Most viral infections are not serious and resolve on their own. However, some infections may cause severe symptoms and may lead to further complications. SYMPTOMS Viruses can frequently cause:  Minor sore throat.  Aches and pains.  Headaches.  Runny nose.  Different types of rashes.  Watery eyes.  Tiredness.  Cough.  Loss of appetite.  Gastrointestinal infections, resulting in nausea, vomiting, and diarrhea. These symptoms do not respond to antibiotics because the infection is not caused by bacteria. However, you might catch a bacterial infection following the viral infection. This is sometimes called a "superinfection." Symptoms of such a bacterial infection may include:  Worsening sore throat with pus and difficulty swallowing.  Swollen neck glands.  Chills and a high or persistent fever.  Severe headache.  Tenderness over the sinuses.  Persistent overall ill feeling (malaise), muscle aches, and tiredness (fatigue).  Persistent cough.  Yellow, green, or brown mucus production with coughing. HOME CARE INSTRUCTIONS   Only take over-the-counter or prescription medicines for pain, discomfort, diarrhea, or fever as directed by your caregiver.  Drink enough water and fluids to keep your urine clear or pale yellow. Sports drinks can provide valuable electrolytes, sugars, and hydration.  Get plenty of rest and maintain proper nutrition. Soups and broths with crackers or rice are fine. SEEK IMMEDIATE MEDICAL CARE IF:   You have severe headaches, shortness of breath, chest pain, neck pain, or an unusual rash.  You have uncontrolled vomiting, diarrhea, or you are unable to keep down fluids.  You or your child has an oral temperature above 102 F (38.9 C), not controlled by medicine.  Your baby is older than 3 months with a rectal temperature  of 102 F (38.9 C) or higher.  Your baby is 803 months old or younger with a rectal temperature of 100.4 F (38 C) or higher. MAKE SURE YOU:   Understand these instructions.  Will watch your condition.  Will get help right away if you are not doing well or get worse. Document Released: 04/12/2005 Document Revised: 09/25/2011 Document Reviewed: 11/07/2010 [facility] [employer] Patient Information 2014 [employer], [place].

## 2013-07-19 NOTE — ED Notes (Signed)
Patient transported to Ultrasound 

## 2013-08-07 ENCOUNTER — Emergency Department (HOSPITAL_COMMUNITY)
Admission: EM | Admit: 2013-08-07 | Discharge: 2013-08-07 | Discharge status: Against Medical Advice | Payer: Managed Care, Other (non HMO) | Attending: Emergency Medicine | Admitting: Emergency Medicine

## 2013-08-07 ENCOUNTER — Encounter (HOSPITAL_COMMUNITY): Payer: Self-pay | Admitting: Emergency Medicine

## 2013-08-07 DIAGNOSIS — IMO0002 Reserved for concepts with insufficient information to code with codable children: Secondary | ICD-10-CM | POA: Insufficient documentation

## 2013-08-07 DIAGNOSIS — R110 Nausea: Secondary | ICD-10-CM | POA: Insufficient documentation

## 2013-08-07 DIAGNOSIS — I10 Essential (primary) hypertension: Secondary | ICD-10-CM | POA: Insufficient documentation

## 2013-08-07 DIAGNOSIS — Y92410 Unspecified street and highway as the place of occurrence of the external cause: Secondary | ICD-10-CM | POA: Insufficient documentation

## 2013-08-07 DIAGNOSIS — S3981XA Other specified injuries of abdomen, initial encounter: Secondary | ICD-10-CM | POA: Insufficient documentation

## 2013-08-07 DIAGNOSIS — Y9389 Activity, other specified: Secondary | ICD-10-CM | POA: Insufficient documentation
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
## 2013-08-07 NOTE — ED Notes (Signed)
Pt has to leave due to picking up children at daycare

## 2013-08-08 ENCOUNTER — Encounter (HOSPITAL_COMMUNITY): Payer: Self-pay | Admitting: Emergency Medicine

## 2013-08-08 ENCOUNTER — Emergency Department (HOSPITAL_COMMUNITY)
Admission: EM | Admit: 2013-08-08 | Discharge: 2013-08-08 | Disposition: A | Discharge status: Home / Self Care | Payer: Managed Care, Other (non HMO) | Attending: Emergency Medicine | Admitting: Emergency Medicine

## 2013-08-08 DIAGNOSIS — M549 Dorsalgia, unspecified: Secondary | ICD-10-CM

## 2013-08-08 DIAGNOSIS — Y92410 Unspecified street and highway as the place of occurrence of the external cause: Secondary | ICD-10-CM | POA: Insufficient documentation

## 2013-08-08 DIAGNOSIS — IMO0002 Reserved for concepts with insufficient information to code with codable children: Secondary | ICD-10-CM | POA: Insufficient documentation

## 2013-08-08 DIAGNOSIS — Z9889 Other specified postprocedural states: Secondary | ICD-10-CM | POA: Insufficient documentation

## 2013-08-08 DIAGNOSIS — Z862 Personal history of diseases of the blood and blood-forming organs and certain disorders involving the immune mechanism: Secondary | ICD-10-CM | POA: Insufficient documentation

## 2013-08-08 DIAGNOSIS — I10 Essential (primary) hypertension: Secondary | ICD-10-CM | POA: Insufficient documentation

## 2013-08-08 DIAGNOSIS — Z8719 Personal history of other diseases of the digestive system: Secondary | ICD-10-CM | POA: Insufficient documentation

## 2013-08-08 DIAGNOSIS — Y9389 Activity, other specified: Secondary | ICD-10-CM | POA: Insufficient documentation

## 2013-08-08 MED ORDER — IBUPROFEN 800 MG PO TABS: 800.0000 mg | ORAL_TABLET | Freq: Three times a day (TID) | ORAL | Status: DC

## 2013-08-08 MED ORDER — METHOCARBAMOL 500 MG PO TABS: 500.0000 mg | ORAL_TABLET | Freq: Two times a day (BID) | ORAL | Status: DC

## 2013-08-08 NOTE — Discharge Instructions (Signed)
Take the prescribed medication as directed. °Follow-up with your primary care physician if problems occur. °Return to the ED for new or worsening symptoms. ° °
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
## 2013-08-08 NOTE — ED Provider Notes (Signed)
Medical screening examination/treatment/procedure(s) were performed by non-physician practitioner and as supervising physician I was immediately available for consultation/collaboration.  EKG Interpretation   None         Omesha Bowerman, MD 08/08/13 1537 

## 2014-03-22 ENCOUNTER — Emergency Department (HOSPITAL_COMMUNITY)
Admission: EM | Admit: 2014-03-22 | Discharge: 2014-03-22 | Disposition: A | Discharge status: Home / Self Care | Payer: Managed Care, Other (non HMO) | Attending: Emergency Medicine | Admitting: Emergency Medicine

## 2014-03-22 ENCOUNTER — Encounter (HOSPITAL_COMMUNITY): Payer: Self-pay | Admitting: Emergency Medicine

## 2014-03-22 DIAGNOSIS — Z9889 Other specified postprocedural states: Secondary | ICD-10-CM | POA: Diagnosis not present

## 2014-03-22 DIAGNOSIS — Z862 Personal history of diseases of the blood and blood-forming organs and certain disorders involving the immune mechanism: Secondary | ICD-10-CM | POA: Diagnosis not present

## 2014-03-22 DIAGNOSIS — K529 Noninfective gastroenteritis and colitis, unspecified: Secondary | ICD-10-CM

## 2014-03-22 DIAGNOSIS — I10 Essential (primary) hypertension: Secondary | ICD-10-CM | POA: Insufficient documentation

## 2014-03-22 DIAGNOSIS — R1084 Generalized abdominal pain: Secondary | ICD-10-CM | POA: Diagnosis present

## 2014-03-22 DIAGNOSIS — Z3202 Encounter for pregnancy test, result negative: Secondary | ICD-10-CM | POA: Diagnosis not present

## 2014-03-22 DIAGNOSIS — K5289 Other specified noninfective gastroenteritis and colitis: Secondary | ICD-10-CM | POA: Insufficient documentation

## 2014-03-22 LAB — COMPREHENSIVE METABOLIC PANEL
ALBUMIN: 3.4 g/dL — AB (ref 3.5–5.2)
ALT: 5 U/L (ref 0–35)
ANION GAP: 11 (ref 5–15)
AST: 14 U/L (ref 0–37)
Alkaline Phosphatase: 76 U/L (ref 39–117)
BUN: 9 mg/dL (ref 6–23)
CALCIUM: 9 mg/dL (ref 8.4–10.5)
CO2: 24 mEq/L (ref 19–32)
Chloride: 104 mEq/L (ref 96–112)
Creatinine, Ser: 0.85 mg/dL (ref 0.50–1.10)
GFR calc non Af Amer: >90 mL/min (ref >90)
GLUCOSE: 89 mg/dL (ref 70–99)
Potassium: 4.3 mEq/L (ref 3.7–5.3)
Sodium: 139 mEq/L (ref 137–147)
TOTAL PROTEIN: 8 g/dL (ref 6.0–8.3)
Total Bilirubin: 0.2 mg/dL — ABNORMAL LOW (ref 0.3–1.2)

## 2014-03-22 LAB — CBC WITH DIFFERENTIAL/PLATELET
BASOS PCT: 0 % (ref 0–1)
Basophils Absolute: 0 10*3/uL (ref 0.0–0.1)
EOS ABS: 0.1 10*3/uL (ref 0.0–0.7)
EOS PCT: 1 % (ref 0–5)
HCT: 44 % (ref 36.0–46.0)
HEMOGLOBIN: 14.1 g/dL (ref 12.0–15.0)
LYMPHS ABS: 2 10*3/uL (ref 0.7–4.0)
Lymphocytes Relative: 22 % (ref 12–46)
MCH: 27.8 pg (ref 26.0–34.0)
MCHC: 32 g/dL (ref 30.0–36.0)
MCV: 86.8 fL (ref 78.0–100.0)
MONOS PCT: 5 % (ref 3–12)
Monocytes Absolute: 0.4 10*3/uL (ref 0.1–1.0)
Neutro Abs: 6.5 10*3/uL (ref 1.7–7.7)
Neutrophils Relative %: 72 % (ref 43–77)
PLATELETS: 309 10*3/uL (ref 150–400)
RBC: 5.07 MIL/uL (ref 3.87–5.11)
RDW: 13.7 % (ref 11.5–15.5)
WBC: 8.9 10*3/uL (ref 4.0–10.5)

## 2014-03-22 LAB — URINALYSIS, ROUTINE W REFLEX MICROSCOPIC
Bilirubin Urine: NEGATIVE
Glucose, UA: NEGATIVE mg/dL
HGB URINE DIPSTICK: NEGATIVE
Ketones, ur: NEGATIVE mg/dL
LEUKOCYTES UA: NEGATIVE
NITRITE: NEGATIVE
PROTEIN: NEGATIVE mg/dL
Specific Gravity, Urine: 1.026 (ref 1.005–1.030)
UROBILINOGEN UA: 0.2 mg/dL (ref 0.0–1.0)
pH: 6 (ref 5.0–8.0)

## 2014-03-22 LAB — LIPASE, BLOOD: LIPASE: 25 U/L (ref 11–59)

## 2014-03-22 LAB — POC URINE PREG, ED: PREG TEST UR: NEGATIVE

## 2014-03-22 MED ORDER — ONDANSETRON 4 MG PO TBDP: ORAL_TABLET | ORAL | Status: DC

## 2014-03-22 MED ORDER — LOPERAMIDE HCL 2 MG PO CAPS
4.0000 mg | ORAL_CAPSULE | Freq: Once | ORAL | Status: AC
  Administered 2014-03-22: 4 mg via ORAL
  Filled 2014-03-22: qty 2

## 2014-03-22 MED ORDER — ONDANSETRON HCL 4 MG/2ML IJ SOLN
4.0000 mg | Freq: Once | INTRAMUSCULAR | Status: AC
  Administered 2014-03-22: 4 mg via INTRAVENOUS
  Filled 2014-03-22: qty 2

## 2014-03-22 MED ORDER — SODIUM CHLORIDE 0.9 % IV BOLUS (SEPSIS)
1000.0000 mL | Freq: Once | INTRAVENOUS | Status: AC
  Administered 2014-03-22: 1000 mL via INTRAVENOUS

## 2014-03-22 MED ORDER — LOPERAMIDE HCL 2 MG PO CAPS: 2.0000 mg | ORAL_CAPSULE | Freq: Four times a day (QID) | ORAL | Status: DC | PRN

## 2014-03-22 MED ORDER — MORPHINE SULFATE 4 MG/ML IJ SOLN
4.0000 mg | Freq: Once | INTRAMUSCULAR | Status: AC
  Administered 2014-03-22: 4 mg via INTRAVENOUS
  Filled 2014-03-22: qty 1

## 2014-03-22 NOTE — ED Notes (Addendum)
Pt states that she has not been feeling well for the past 3-4 days. Pt states that she has generalized abdominal pain that started two days ago, which she describes as "tightness." Pt states she has been belching a lot. Pt also states that she has had nausea, emesis, and diarrhea. Pt denies vaginal bleeding or discharge. Pt states that she has been under increased stress due to being a single parent and working a lot, which she also recently moved causing more stress.

## 2014-03-22 NOTE — ED Notes (Addendum)
Initial contact - Pt. A+0x4. Pt reports generalized abd pain x4 days.  Pt reports n/v.  Pt reports one episode of vomiting this morning, also "on and off the toilet all day" with diarrhea.  Pt reports being stressed due to working long shifts and being a single parent.  Pt reports being able to drink water and gingerale and keeping it down but feels dehydrated.  Pt abd soft and non tender to palpation.  Skin PWD. Speaking full/clear sentences.  NAD.

## 2014-03-22 NOTE — Discharge Instructions (Signed)
1. Medications: zofran, imodium, usual home medications 2. Treatment: rest, drink plenty of fluids, advance diet slowly 3. Follow Up: Please followup with your primary doctor for discussion of your diagnoses and further evaluation after today's visit; if you do not have a primary care doctor use the resource guide provided to find one;   Viral Gastroenteritis Viral gastroenteritis is also known as stomach flu. This condition affects the stomach and intestinal tract. It can cause sudden diarrhea and vomiting. The illness typically lasts 3 to 8 days. Most people develop an immune response that eventually gets rid of the virus. While this natural response develops, the virus can make you quite ill. CAUSES  Many different viruses can cause gastroenteritis, such as rotavirus or noroviruses. You can catch one of these viruses by consuming contaminated food or water. You may also catch a virus by sharing utensils or other personal items with an infected person or by touching a contaminated surface. SYMPTOMS  The most common symptoms are diarrhea and vomiting. These problems can cause a severe loss of body fluids (dehydration) and a body salt (electrolyte) imbalance. Other symptoms may include:  Fever.  Headache.  Fatigue.  Abdominal pain. DIAGNOSIS  Your caregiver can usually diagnose viral gastroenteritis based on your symptoms and a physical exam. A stool sample may also be taken to test for the presence of viruses or other infections. TREATMENT  This illness typically goes away on its own. Treatments are aimed at rehydration. The most serious cases of viral gastroenteritis involve vomiting so severely that you are not able to keep fluids down. In these cases, fluids must be given through an intravenous line (IV). HOME CARE INSTRUCTIONS   Drink enough fluids to keep your urine clear or pale yellow. Drink small amounts of fluids frequently and increase the amounts as tolerated.  Ask your  caregiver for specific rehydration instructions.  Avoid:  Foods high in sugar.  Alcohol.  Carbonated drinks.  Tobacco.  Juice.  Caffeine drinks.  Extremely hot or cold fluids.  Fatty, greasy foods.  Too much intake of anything at one time.  Dairy products until 24 to 48 hours after diarrhea stops.  You may consume probiotics. Probiotics are active cultures of beneficial bacteria. They may lessen the amount and number of diarrheal stools in adults. Probiotics can be found in yogurt with active cultures and in supplements.  Wash your hands well to avoid spreading the virus.  Only take over-the-counter or prescription medicines for pain, discomfort, or fever as directed by your caregiver. Do not give aspirin to children. Antidiarrheal medicines are not recommended.  Ask your caregiver if you should continue to take your regular prescribed and over-the-counter medicines.  Keep all follow-up appointments as directed by your caregiver. SEEK IMMEDIATE MEDICAL CARE IF:   You are unable to keep fluids down.  You do not urinate at least once every 6 to 8 hours.  You develop shortness of breath.  You notice blood in your stool or vomit. This may look like coffee grounds.  You have abdominal pain that increases or is concentrated in one small area (localized).  You have persistent vomiting or diarrhea.  You have a fever.  The patient is a child younger than 3 months, and he or she has a fever.  The patient is a child older than 3 months, and he or she has a fever and persistent symptoms.  The patient is a child older than 3 months, and he or she has a fever and  symptoms suddenly get worse.  The patient is a baby, and he or she has no tears when crying. MAKE SURE YOU:   Understand these instructions.  Will watch your condition.  Will get help right away if you are not doing well or get worse. Document Released: 07/03/2005 Document Revised: 09/25/2011 Document  Reviewed: 04/19/2011 [facility] Patient Information 2015 [place], [place]. This information is not intended to replace advice given to you by your health care provider. Make sure you discuss any questions you have with your health care provider.    Emergency Department Resource Guide 1) Find a Doctor and Pay Out of Pocket Although you won't have to find out who is covered by your insurance plan, it is a good idea to ask around and get recommendations. You will then need to call the office and see if the doctor you have chosen will accept you as a new patient and what types of options they offer for patients who are self-pay. Some doctors offer discounts or will set up payment plans for their patients who do not have insurance, but you will need to ask so you aren't surprised when you get to your appointment.  2) Contact Your Local Health Department Not all health departments have doctors that can see patients for sick visits, but many do, so it is worth a call to see if yours does. If you don't know where your local health department is, you can check in your phone book. The CDC also has a tool to help you locate your state's health department, and many state websites also have listings of all of their local health departments.  3) Find a Walk-in Clinic If your illness is not likely to be very severe or complicated, you may want to try a walk in clinic. These are popping up all over the country in pharmacies, drugstores, and shopping centers. They're usually staffed by nurse practitioners or physician assistants that have been trained to treat common illnesses and complaints. They're usually fairly quick and inexpensive. However, if you have serious medical issues or chronic medical problems, these are probably not your best option.  No Primary Care Doctor: - Call Health Connect at  [phone] - they can help you locate a primary care doctor that  accepts your insurance, provides certain services,  etc. - Physician Referral Service- [phone]  Chronic Pain Problems: Organization         Address  Phone   Notes  [facility]  [phone] Patients need to be referred by their primary care doctor.   Medication Assistance: Organization         Address  Phone   Notes  [facility] [address] [place], [place] [address] [phone] --Must be a resident of [facility] -- Must have NO insurance coverage whatsoever (no Medicaid/ Medicare, etc.) -- The pt. MUST have a primary care doctor that directs their care regularly and follows them in the community   [facility]  [phone]   [employer]  [phone]    Agencies that provide inexpensive medical care: Organization         Address  Phone   Notes  [facility]  [phone]   [facility]    [phone]   [facility] [address] [place], [place] [address] [phone]   [facility] [address] [place], [place] [phone]  Planned Parenthood    [phone]   [facility]    [phone]   [facility] and [facility]  [address], [place] Phone:  [phone], Fax:  [phone] Hours of Operation:  9 am - 6 pm, M-F.  Also accepts Medicaid/Medicare and self-pay.  [facility]  [address], [place] Phone: [phone], Fax: [phone]. Hours of Operation:  8:30 am - 5:30 pm, M-F.  Also accepts Medicaid and self-pay.  [facility] [place] [address], [place] Phone: [phone]   [facility] [address] Natasha Bence [place], [place] [phone], Ext. [phone] Mondays & Thursdays: 7-9 AM.  First 15 patients are seen on a first come, first serve basis.    Medicaid-accepting [facility] Providers:  Organization         Address  Phone   Notes  [facility] [address],  [phone] Also accepts self-pay patients.  [facility] [address] Laurell Josephs [place], [place]  [phone]   [facility] [address], [place] [phone]   [facility] [address] [place] [phone]   Renaye Rakers [address], [place]   [phone] Only accepts [place] Access [place] patients after they have their name applied to their card.   Self-Pay (no insurance) in [facility]:  Organization         Address  Phone   Notes  Sickle Cell Patients, [facility] [address] [place], [place] [phone]   [facility] [address] [place], [place] [phone]   [facility]  [address], [place] [phone]   [facility]/Dr. Osei-Bonsu  [address] [place] or [address], [place] [phone] Phone number for both [place] and [place] locations is the same.  [facility] [address] [place] [phone]   [facility] [address] [place] or [address] [phone]   [facility] [address] [place] [phone], phone; [phone], fax Sees patients 1st and 3rd Saturday of every month.  Must not qualify for public or private insurance (i.e. Medicaid, Medicare, [place] Health Choice, Veterans' Benefits)  Household income should be no more than 200% of the poverty level The clinic cannot treat you if you are pregnant or think you are pregnant  Sexually transmitted diseases are not treated at the clinic.    Dental Care: Organization         Address  Phone  Notes  [facility] Department of [facility] [address] [place], [place] [phone] Accepts children up to  age 17 who are enrolled in [place] or [place] Health Choice; pregnant women with a Medicaid card; and children who have applied for Medicaid or [place] Health Choice, but were declined, whose parents can pay a reduced fee at time of service.  [facility]  [address], [place] [phone] Accepts children up to age 52 who are enrolled in [place] or [place] Health Choice; pregnant women with a Medicaid card; and children who have applied for Medicaid or [place] Health Choice, but were declined,  whose parents can pay a reduced fee at time of service.  [facility]  [address] [place], [place] [phone] Patients are seen by appointment only. Walk-ins are not accepted. [facility] will see patients 90 years of age and older. Monday - Tuesday (8am-5pm) Most Wednesdays (8:30-5pm) $30 per visit, cash only  [facility]  [address], [facility] [phone] Patients are seen by appointment only. Walk-ins are not accepted. [facility] will see patients 57 years of age and older. One Wednesday Evening (Monthly: Volunteer Based).  $30 per visit, cash only  [employer]  [phone] for adults; Children under age 19, call Graduate Pediatric Dentistry at [phone]. Children aged 66-14, please call [phone] to request a pediatric application.  Dental services are provided in all areas of dental care including fillings, crowns and bridges, complete and partial dentures, implants, gum treatment, root canals, and extractions. Preventive care is also provided. Treatment is provided to both adults and children. Patients are selected via a lottery and there is often a waiting list.   [facility] [address], [place]  [phone] [email or web address]   [facility] [address] [place], [place] [phone], Ext. [phone] Second and Fourth Thursday of  each month, opens at 6:30 AM; Clinic ends at 9 AM.  Patients are seen on a first-come first-served basis, and a limited number are seen during each clinic.   [facility]  [address] Ether Griffins [place], [place] [phone]   Eligibility Requirements You must have lived in [place], [place], or [place] counties for at least the last three months.   You cannot be eligible for state or federal sponsored [place], including [employer], [place], or [employer].   You generally cannot be eligible for healthcare insurance through your employer.    How to apply: Eligibility screenings are held every Tuesday and Wednesday afternoon from 1:00 pm until 4:00 pm. You do not need an appointment for the interview!  [facility] [address] [place], [place] [phone]   [facility] Health Department  [phone]   [facility] Health Department  [phone]   [facility] Health Department  [phone]    Behavioral Health Resources in the Community: Intensive Outpatient Programs Organization         Address  Phone  Notes  [facility] [address] [place], [place] [phone]   [facility] [address] [place], [place] [phone]   [facility] [address], [place], [place]  [phone]   [facility] [address]  [place], [place] [phone] or [phone]   Substance Abuse Resources Organization         Address  Phone  Notes  [facility]  [phone]   [facility]  [phone]   [facility]  [phone]   Floydene Flock  [phone]   Residential & Outpatient Substance Abuse Program  [phone]   Psychological Services Organization         Address  Phone  Notes  [facility]  [phone]   [facility]  [phone]   [facility] Mental Health [address]  [place] [phone] or [phone]    Mobile Crisis Teams Organization         Address  Phone  Notes  [facility], Mobile  Crisis Care Unit  [phone]   [facility]  [address] [place], [place] [phone]   [facility] [address] [place] [phone]    Self-Help/Support Groups Organization         Address  Phone             Notes  [facility] [place] - variety of support groups  336- I7437963 Call for more information  Narcotics Anonymous (NA), [facility] [address], [employer] [place]  2 meetings at this location   Statistician         Address  Phone  Notes  [facility] [address],    [place]  [phone]   [facility]  [address], [place] [address], [place], [place] [phone]   [facility] [address] [place], [place] [phone] Admissions: 8am-3pm M-F  [facility] [address],    [place], [place] [phone]   [facility] [address] [place], [place], [place] [phone]   [facility] [address]  [place], [place] [phone]   [facility] - Intensive Outpatient [address] Laurell Josephs [address], [place], [place] [phone]   [facility] (Addiction [facility] [address] [place]  [place], [place] [phone] or [phone]   [facility] ([facility]) [address], [place], [place] [phone] Accepts Medicaid  [facility] [address],  [place] [phone] Substance Abuse/Addiction Treatment   [facility] Organization         Address  Phone  Notes  [facility]  [phone]   Angie Fava, PhD [address] Ervin Knack [place], [place]   [phone] or [phone]   [facility] Behavioral   [address] [place], [place] [phone]     [facility] [address] [place], [place] [phone] Insurance/Medicaid/sponsorship through [facility] [address]                                    [place], [place] [phone] Therapy/tele-psych/case  [facility] [address], [place] [phone]    Dr. Lolly Mustache  [phone]   [facility]  [employer] [facility] Dept. 1) [address] [place] 2) [address], [place] 3)  [address], [place] [phone]  [phone]   [facility] Child Abuse Hotline [phone] or [phone] (After Hours)

## 2014-03-22 NOTE — ED Provider Notes (Signed)
CSN: [id number]     Arrival date & time 03/22/14  1726 History   First MD Initiated Contact with Patient 03/22/14 1819     Chief Complaint  Patient presents with  . Abdominal Pain     (Consider location/radiation/quality/duration/timing/severity/associated sxs/prior Treatment) Patient is a 23 y.o. female presenting with abdominal pain. The history is provided by the patient and medical records. No language interpreter was used.  Abdominal Pain Associated symptoms: fatigue, nausea and vomiting   Associated symptoms: no chest pain, no constipation, no cough, no diarrhea, no dysuria, no fever, no hematuria and no shortness of breath     Gloria Chase is a 23 y.o. female  with a hx of anemia, hypertension presents to the Emergency Department complaining of gradual, persistent, progressively worsening diarrhea onset this morning. Associated symptoms include nausea and one episode of emesis. Emesis was stomach contents and nonbloody nonbilious.  Diarrhea has been watery without melena or hematochezia. Patient reports associated mild generalized abdominal pain described as cramping and "rolling."  She reports she can hear the gas rolling her stomach. Pain is worse just before defecation and relieved afterwards. No over-the-counter treatment attempted. Nothing makes the symptoms better or worse. She denies recent contacts.  Patient reports she is sexually active with 1 female partner. Last menstrual cycle was 02/25/2012. She reports 2 previous pregnancies with living children.  Patient is tearful during history taking reporting that she is a single mother living in a new apartment and working a full-time job. She reports she's been feeling "rundown and stressed out lately."  Pt denies fever, chills, headache neck pain, chest pain, shortness of breath, dizziness, syncope, dysuria, hematuria, vaginal discharge, vaginal odor, vaginal itching.  Patient denies recent travel outside of the country, new water  sources or camping.   Past Medical History  Diagnosis Date  . Anemia   . Hx of hernia repair 2000  . History of wisdom tooth extraction 2005  . History of colposcopy with cervical biopsy 01/11/2011  . Hypertension    Past Surgical History  Procedure Laterality Date  . No past surgeries     No family history on file. History  Substance Use Topics  . Smoking status: Never Smoker   . Smokeless tobacco: Not on file  . Alcohol Use: No   OB History   Grav Para Term Preterm Abortions TAB SAB Ect Mult Living   0 2 1 0 1 0 2     Review of Systems  Constitutional: Positive for fatigue. Negative for fever, diaphoresis, appetite change and unexpected weight change.  HENT: Negative for mouth sores and trouble swallowing.   Respiratory: Negative for cough, chest tightness, shortness of breath, wheezing and stridor.   Cardiovascular: Negative for chest pain and palpitations.  Gastrointestinal: Positive for nausea, vomiting and abdominal pain. Negative for diarrhea, constipation, blood in stool, abdominal distention and rectal pain.  Genitourinary: Negative for dysuria, urgency, frequency, hematuria, flank pain and difficulty urinating.  Musculoskeletal: Negative for back pain, neck pain and neck stiffness.  Skin: Negative for rash.  Neurological: Negative for weakness.  Hematological: Negative for adenopathy.  Psychiatric/Behavioral: Negative for confusion.  All other systems reviewed and are negative.     Allergies  Review of patient's allergies indicates no known allergies.  Home Medications   Prior to Admission medications   Medication Sig Start Date End Date Taking? Authorizing Provider  ibuprofen (ADVIL,MOTRIN) 200 MG tablet Take 400 mg by mouth every 6 (six) hours as needed for  mild pain.   Yes Historical Provider, MD  loperamide (IMODIUM) 2 MG capsule Take 1 capsule (2 mg total) by mouth 4 (four) times daily as needed for diarrhea or loose stools. 03/22/14   Bowdy Bair, PA-C  ondansetron (ZOFRAN ODT) 4 MG disintegrating tablet  ODT q4 hours prn nausea/vomit 03/22/14   Emarie Paul, PA-C   BP 125/90  Pulse 90  Temp(Src) 98.4 F (36.9 C) (Oral)  Resp 16  SpO2 100%  LMP 02/24/2014 Physical Exam  Nursing note and vitals reviewed. Constitutional: She appears well-developed and well-nourished. No distress.  Awake, alert, nontoxic appearance  HENT:  Head: Normocephalic and atraumatic.  Mouth/Throat: Oropharynx is clear and moist. No oropharyngeal exudate.  Eyes: Conjunctivae are normal. No scleral icterus.  Neck: Normal range of motion. Neck supple.  Cardiovascular: Normal rate, regular rhythm, normal heart sounds and intact distal pulses.   No murmur heard. No tachycardia  Pulmonary/Chest: Effort normal and breath sounds normal. No respiratory distress. She has no wheezes.  Equal chest expansion  Abdominal: Soft. She exhibits no distension and no mass. Bowel sounds are increased. There is no hepatosplenomegaly. There is no tenderness. There is no rebound, no guarding and no CVA tenderness.  Abdomen soft and nontender Increased bowel sounds No CVA tenderness   Musculoskeletal: Normal range of motion. She exhibits no edema.  Neurological: She is alert.  Speech is clear and goal oriented Moves extremities without ataxia  Skin: Skin is warm and dry. She is not diaphoretic.  Psychiatric: She has a normal mood and affect.    ED Course  Procedures (including critical care time) Labs Review Labs Reviewed  COMPREHENSIVE METABOLIC PANEL - Abnormal; Notable for the following:    Albumin 3.4 (*)    Total Bilirubin 0.2 (*)    All other components within normal limits  CBC WITH DIFFERENTIAL  LIPASE, BLOOD  URINALYSIS, ROUTINE W REFLEX MICROSCOPIC  POC URINE PREG, ED    Imaging Review No results found.   EKG Interpretation None      MDM   Final diagnoses:  Gastroenteritis   Gloria Chase presents with abdominal  cramping, diarrhea and one episode of emesis.  Patient is tearful through history and physical reporting that she is stressed out.  Will obtain basic labs, give pain medication and reassess.  9:07 PM Patient labs reassuring. On repeat exam abdomen remained soft and nontender. Patient has passed by mouth trial without further episodes of emesis. No further episodes of diarrhea here in the emergency department.  Patient with symptoms consistent with viral gastroenteritis.  Vitals are stable, no fever.  No signs of dehydration, tolerating PO fluids > 6 oz.  Patient given fluid bolus.  Lungs are clear.  No focal abdominal pain, no concern for appendicitis, cholecystitis, pancreatitis, ruptured viscus, UTI, kidney stone, or any other abdominal etiology.  Supportive therapy indicated with return if symptoms worsen.    I have personally reviewed patient's vitals, nursing note and any pertinent labs or imaging.  I performed an undressed physical exam.    At this time, it has been determined that no acute conditions requiring further emergency intervention. The patient/guardian have been advised of the diagnosis and plan. I reviewed all labs and imaging including any potential incidental findings. We have discussed signs and symptoms that warrant return to the ED, such as intractable vomiting, hematemesis, melena or hematochezia.  Patient/guardian has voiced understanding and agreed to follow-up with the PCP or specialist in 3 days.  Vital signs are stable  at discharge.   BP 125/90  Pulse 90  Temp(Src) 98.4 F (36.9 C) (Oral)  Resp 16  SpO2 100%  LMP 02/24/2014        Dierdre Forth, PA-C 03/22/14 2108

## 2014-03-23 NOTE — ED Provider Notes (Signed)
History/physical exam/procedure(s) were performed by non-physician practitioner and as supervising physician I was immediately available for consultation/collaboration. I have reviewed all notes and am in agreement with care and plan.   Kassiah Mccrory S Acheron Sugg, MD 03/23/14 0000 

## 2014-05-18 ENCOUNTER — Encounter (HOSPITAL_COMMUNITY): Payer: Self-pay | Admitting: Emergency Medicine

## 2014-09-29 ENCOUNTER — Encounter (HOSPITAL_COMMUNITY): Payer: Self-pay | Admitting: Emergency Medicine

## 2014-09-29 ENCOUNTER — Emergency Department (HOSPITAL_COMMUNITY)
Admission: EM | Admit: 2014-09-29 | Discharge: 2014-09-29 | Disposition: A | Discharge status: Home / Self Care | Payer: Managed Care, Other (non HMO) | Attending: Emergency Medicine | Admitting: Emergency Medicine

## 2014-09-29 DIAGNOSIS — S0990XA Unspecified injury of head, initial encounter: Secondary | ICD-10-CM | POA: Diagnosis not present

## 2014-09-29 DIAGNOSIS — K08109 Complete loss of teeth, unspecified cause, unspecified class: Secondary | ICD-10-CM | POA: Diagnosis not present

## 2014-09-29 DIAGNOSIS — Y9389 Activity, other specified: Secondary | ICD-10-CM | POA: Diagnosis not present

## 2014-09-29 DIAGNOSIS — Z862 Personal history of diseases of the blood and blood-forming organs and certain disorders involving the immune mechanism: Secondary | ICD-10-CM | POA: Diagnosis not present

## 2014-09-29 DIAGNOSIS — S4991XA Unspecified injury of right shoulder and upper arm, initial encounter: Secondary | ICD-10-CM | POA: Insufficient documentation

## 2014-09-29 DIAGNOSIS — I10 Essential (primary) hypertension: Secondary | ICD-10-CM | POA: Diagnosis not present

## 2014-09-29 DIAGNOSIS — Y998 Other external cause status: Secondary | ICD-10-CM | POA: Diagnosis not present

## 2014-09-29 DIAGNOSIS — Z79899 Other long term (current) drug therapy: Secondary | ICD-10-CM | POA: Diagnosis not present

## 2014-09-29 DIAGNOSIS — M545 Low back pain, unspecified: Secondary | ICD-10-CM

## 2014-09-29 DIAGNOSIS — S3992XA Unspecified injury of lower back, initial encounter: Secondary | ICD-10-CM | POA: Diagnosis present

## 2014-09-29 DIAGNOSIS — Y92410 Unspecified street and highway as the place of occurrence of the external cause: Secondary | ICD-10-CM | POA: Diagnosis not present

## 2014-09-29 DIAGNOSIS — Z9889 Other specified postprocedural states: Secondary | ICD-10-CM | POA: Insufficient documentation

## 2014-09-29 DIAGNOSIS — S3991XA Unspecified injury of abdomen, initial encounter: Secondary | ICD-10-CM | POA: Diagnosis not present

## 2014-09-29 DIAGNOSIS — S199XXA Unspecified injury of neck, initial encounter: Secondary | ICD-10-CM | POA: Diagnosis not present

## 2014-09-29 MED ORDER — DIAZEPAM 5 MG PO TABS
5.0000 mg | ORAL_TABLET | Freq: Once | ORAL | Status: AC
  Administered 2014-09-29: 5 mg via ORAL
  Filled 2014-09-29: qty 1

## 2014-09-29 MED ORDER — KETOROLAC TROMETHAMINE 60 MG/2ML IM SOLN
60.0000 mg | Freq: Once | INTRAMUSCULAR | Status: AC
  Administered 2014-09-29: 60 mg via INTRAMUSCULAR
  Filled 2014-09-29: qty 2

## 2014-09-29 MED ORDER — KETOROLAC TROMETHAMINE 30 MG/ML IJ SOLN: 30.0000 mg | Freq: Once | INTRAMUSCULAR | Status: DC

## 2014-09-29 MED ORDER — METHOCARBAMOL 500 MG PO TABS: 500.0000 mg | ORAL_TABLET | Freq: Two times a day (BID) | ORAL | Status: DC

## 2014-09-29 MED ORDER — NAPROXEN 500 MG PO TABS: 500.0000 mg | ORAL_TABLET | Freq: Two times a day (BID) | ORAL | Status: DC
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
## 2014-09-29 NOTE — Discharge Instructions (Signed)
Back Pain, Adult Low back pain is very common. About 1 in 5 people have back pain.The cause of low back pain is rarely dangerous. The pain often gets better over time.About half of people with a sudden onset of back pain feel better in just 2 weeks. About 8 in 10 people feel better by 6 weeks.  CAUSES Some common causes of back pain include:  Strain of the muscles or ligaments supporting the spine.  Wear and tear (degeneration) of the spinal discs.  Arthritis.  Direct injury to the back. DIAGNOSIS Most of the time, the direct cause of low back pain is not known.However, back pain can be treated effectively even when the exact cause of the pain is unknown.Answering your caregiver's questions about your overall health and symptoms is one of the most accurate ways to make sure the cause of your pain is not dangerous. If your caregiver needs more information, he or she may order lab work or imaging tests (X-rays or MRIs).However, even if imaging tests show changes in your back, this usually does not require surgery. HOME CARE INSTRUCTIONS For many people, back pain returns.Since low back pain is rarely dangerous, it is often a condition that people can learn to manageon their own.   Remain active. It is stressful on the back to sit or stand in one place. Do not sit, drive, or stand in one place for more than 30 minutes at a time. Take short walks on level surfaces as soon as pain allows.Try to increase the length of time you walk each day.  Do not stay in bed.Resting more than 1 or 2 days can delay your recovery.  Do not avoid exercise or work.Your body is made to move.It is not dangerous to be active, even though your back may hurt.Your back will likely heal faster if you return to being active before your pain is gone.  Pay attention to your body when you bend and lift. Many people have less discomfortwhen lifting if they bend their knees, keep the load close to their bodies,and  avoid twisting. Often, the most comfortable positions are those that put less stress on your recovering back.  Find a comfortable position to sleep. Use a firm mattress and lie on your side with your knees slightly bent. If you lie on your back, put a pillow under your knees.  Only take over-the-counter or prescription medicines as directed by your caregiver. Over-the-counter medicines to reduce pain and inflammation are often the most helpful.Your caregiver may prescribe muscle relaxant drugs.These medicines help dull your pain so you can more quickly return to your normal activities and healthy exercise.  Put ice on the injured area.  Put ice in a plastic bag.  Place a towel between your skin and the bag.  Leave the ice on for 15-20 minutes, 03-04 times a day for the first 2 to 3 days. After that, ice and heat may be alternated to reduce pain and spasms.  Ask your caregiver about trying back exercises and gentle massage. This may be of some benefit.  Avoid feeling anxious or stressed.Stress increases muscle tension and can worsen back pain.It is important to recognize when you are anxious or stressed and learn ways to manage it.Exercise is a great option. SEEK MEDICAL CARE IF:  You have pain that is not relieved with rest or medicine.  You have pain that does not improve in 1 week.  You have new symptoms.  You are generally not feeling well. SEEK   IMMEDIATE MEDICAL CARE IF:   You have pain that radiates from your back into your legs.  You develop new bowel or bladder control problems.  You have unusual weakness or numbness in your arms or legs.  You develop nausea or vomiting.  You develop abdominal pain.  You feel faint. Document Released: 07/03/2005 Document Revised: 01/02/2012 Document Reviewed: 11/04/2013 ExitCare Patient Information 2015 [employer]. This information is not intended to replace advice given to you by your health care provider. Make sure you  discuss any questions you have with your health care provider.  

## 2014-09-29 NOTE — ED Notes (Signed)
Pt reports neck and lower back pain.
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
## 2014-10-01 ENCOUNTER — Other Ambulatory Visit: Payer: Self-pay | Admitting: Gastroenterology

## 2014-10-01 DIAGNOSIS — R1011 Right upper quadrant pain: Secondary | ICD-10-CM

## 2014-10-19 ENCOUNTER — Ambulatory Visit (HOSPITAL_COMMUNITY): Admission: RE | Admit: 2014-10-19 | Payer: Managed Care, Other (non HMO) | Source: Ambulatory Visit

## 2014-10-29 ENCOUNTER — Ambulatory Visit (HOSPITAL_COMMUNITY): Payer: Managed Care, Other (non HMO)

## 2014-11-09 ENCOUNTER — Ambulatory Visit (HOSPITAL_COMMUNITY)
Admission: RE | Admit: 2014-11-09 | Discharge: 2014-11-09 | Disposition: A | Discharge status: Home / Self Care | Payer: Managed Care, Other (non HMO) | Source: Ambulatory Visit | Attending: Gastroenterology | Admitting: Gastroenterology

## 2014-11-09 DIAGNOSIS — R110 Nausea: Secondary | ICD-10-CM | POA: Diagnosis not present

## 2014-11-09 DIAGNOSIS — R1011 Right upper quadrant pain: Secondary | ICD-10-CM | POA: Diagnosis present

## 2014-11-09 MED ORDER — TECHNETIUM TC 99M MEBROFENIN IV KIT
5.3000 | PACK | Freq: Once | INTRAVENOUS | Status: AC | PRN
  Administered 2014-11-09: 5 via INTRAVENOUS

## 2014-11-09 MED ORDER — SINCALIDE 5 MCG IJ SOLR
0.0200 ug/kg | Freq: Once | INTRAMUSCULAR | Status: AC
  Administered 2014-11-09: 2 ug via INTRAVENOUS

## 2015-07-26 IMAGING — NM NM HEPATO W/GB/PHARM/[PERSON_NAME]
1 series · 12 of 12 positions shown · non-contrast
Comparison: None.

CLINICAL DATA: One month history of right upper quadrant pain and
nausea

EXAM:
NUCLEAR MEDICINE HEPATOBILIARY IMAGING WITH GALLBLADDER EF
Views:  Anterior right upper quadrant
Radionuclide:  Technetium 99m  Choletec
Dose:  5.3 mCi
Route of administration: Intravenous

[Series 1: hepato · 4.46mm/px · 2 acquisitions, 12 frames shown]
[im 1/2]
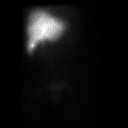
[im 1/2]
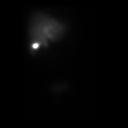
[im 1/2]
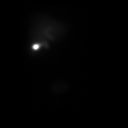
[im 1/2]
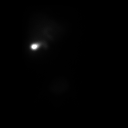
[im 1/2]
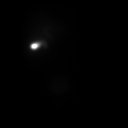
[im 1/2]
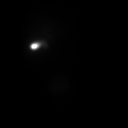
[im 2/2]
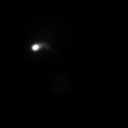
[im 2/2]
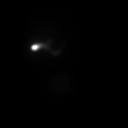
[im 2/2]
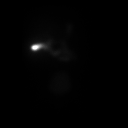
[im 2/2]
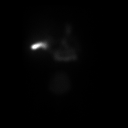
[im 2/2]
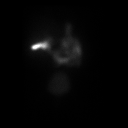
[im 2/2]
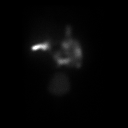

[12 of 12 positions shown; findings below may reference images not displayed]

FINDINGS: Liver uptake of radiotracer is normal. There is prompt visualization
of gallbladder and small bowel, indicating patency of the cystic and
common bile ducts. A weight based dose, 2.0 mcg, of CCK was
administered intravenously with calculation of the computer
generated ejection fraction of radiotracer from the gallbladder. The
patient experienced slight abdominal pain with the CCK
administration. The computer generated ejection fraction of
radiotracer from the gallbladder is normal at 75%, normal greater
than 38%.
IMPRESSION: Normal ejection fraction of radiotracer from the gallbladder. The
patient did experience slight pain with the CCK administration.
Cystic and common bile ducts are patent as is evidenced by
visualization of gallbladder and small bowel.

## 2015-10-11 LAB — CYTOLOGY - PAP: Pap: ABNORMAL — AB

## 2015-11-26 ENCOUNTER — Emergency Department (HOSPITAL_COMMUNITY)
Admission: EM | Admit: 2015-11-26 | Discharge: 2015-11-26 | Disposition: A | Discharge status: Home / Self Care | Payer: Medicaid Other | Attending: Emergency Medicine | Admitting: Emergency Medicine

## 2015-11-26 ENCOUNTER — Encounter (HOSPITAL_COMMUNITY): Payer: Self-pay

## 2015-11-26 DIAGNOSIS — Z792 Long term (current) use of antibiotics: Secondary | ICD-10-CM | POA: Diagnosis not present

## 2015-11-26 DIAGNOSIS — Z79899 Other long term (current) drug therapy: Secondary | ICD-10-CM | POA: Insufficient documentation

## 2015-11-26 DIAGNOSIS — I10 Essential (primary) hypertension: Secondary | ICD-10-CM | POA: Diagnosis not present

## 2015-11-26 DIAGNOSIS — J029 Acute pharyngitis, unspecified: Secondary | ICD-10-CM

## 2015-11-26 DIAGNOSIS — Z791 Long term (current) use of non-steroidal anti-inflammatories (NSAID): Secondary | ICD-10-CM | POA: Diagnosis not present

## 2015-11-26 LAB — RAPID STREP SCREEN (MED CTR MEBANE ONLY): STREPTOCOCCUS, GROUP A SCREEN (DIRECT): NEGATIVE

## 2015-11-26 MED ORDER — HYDROCODONE-ACETAMINOPHEN 5-325 MG PO TABS
1.0000 | ORAL_TABLET | Freq: Once | ORAL | Status: AC
  Administered 2015-11-26: 1 via ORAL
  Filled 2015-11-26: qty 1

## 2015-11-26 MED ORDER — IBUPROFEN 800 MG PO TABS: 800.0000 mg | ORAL_TABLET | Freq: Three times a day (TID) | ORAL | Status: DC | PRN

## 2015-11-26 MED ORDER — AZITHROMYCIN 250 MG PO TABS: ORAL_TABLET | ORAL | Status: DC

## 2015-11-26 NOTE — Discharge Instructions (Signed)
Follow up if not improving

## 2015-11-26 NOTE — Progress Notes (Addendum)
Pt seen by [facility] staff but confirms she has full  Medicaid but no pcp  CM provided pt with a list of [place]icaid provider list to assist in her choice of medicaid provider for f/u care CM discussed and answered questions about medicaid list of medication when pt stated her htn medication cost $17 and she has not obtained it since. CM encouraged pt to [facility] or drug list and to encouraged any provider to offer her a htn medication from the list to decrease her cost Pt stated she had been prescribed the same htn med she had been using "when I had insurance under my parents and the cost changed"   Pt appreciative of resources offered and questions answered  Pt encouraged to Please contact the Dept of Social services case worker to have a local medicaid accepting primary care doctor entered pror to going to one from list  Entered in d/c instructions medicaid [place]: [phone] [address] [place], [place] [id number] [email or web address] Use this website to assist with understanding your coverage & to renew application As a Medicaid client you MUST contact DSS/SSI each time you change address, move to another [place] or another state to keep your address updated  [place] Transportation to Dr appts if you are have full Medicaid: [phone]

## 2015-11-26 NOTE — ED Notes (Signed)
Pt states sore throat, body chills x 1 week.  ? Fever yesterday.  Taking meds without relief.

## 2015-11-26 NOTE — ED Provider Notes (Signed)
CSN: [id number]     Arrival date & time 11/26/15  0930 History   First MD Initiated Contact with Patient 11/26/15 1022     Chief Complaint  Patient presents with  . Sore Throat     (Consider location/radiation/quality/duration/timing/severity/associated sxs/prior Treatment) Patient is a 25 y.o. female presenting with pharyngitis. The history is provided by the patient (Patient complains of a sore throat mild congestion).  Sore Throat This is a new problem. The problem occurs constantly. The problem has not changed since onset.Pertinent negatives include no chest pain, no abdominal pain and no headaches. Nothing aggravates the symptoms. She has tried nothing for the symptoms.    Past Medical History  Diagnosis Date  . Anemia   . Hx of hernia repair 2000  . History of wisdom tooth extraction 2005  . History of colposcopy with cervical biopsy 01/11/2011  . Hypertension    Past Surgical History  Procedure Laterality Date  . No past surgeries     History reviewed. No pertinent family history. Social History  Substance Use Topics  . Smoking status: Never Smoker   . Smokeless tobacco: None  . Alcohol Use: No   OB History    Gravida Para Term Preterm AB TAB SAB Ectopic Multiple Living   4 2 2  0 2 1 0 1 0 2     Review of Systems  Constitutional: Negative for appetite change and fatigue.  HENT: Negative for congestion, ear discharge and sinus pressure.         sore throat  Eyes: Negative for discharge.  Respiratory: Negative for cough.   Cardiovascular: Negative for chest pain.  Gastrointestinal: Negative for abdominal pain and diarrhea.  Genitourinary: Negative for frequency and hematuria.  Musculoskeletal: Negative for back pain.  Skin: Negative for rash.  Neurological: Negative for seizures and headaches.  Psychiatric/Behavioral: Negative for hallucinations.      Allergies  Review of patient's allergies indicates no known allergies.  Home Medications   Prior to  Admission medications   Medication Sig Start Date End Date Taking? Authorizing Provider  hydrochlorothiazide (HYDRODIURIL) 12.5 MG tablet Take 12.5 mg by mouth daily. 09/10/15  Yes Historical Provider, MD  azithromycin (ZITHROMAX Z-PAK) 250 MG tablet 2 po day one, then 1 daily x 4 days 11/26/15   Bethann BerkshireJoseph Dezaria Methot, MD  busPIRone (BUSPAR) 15 MG tablet Take 1 tablet by mouth 3 (three) times daily. 09/15/14   Historical Provider, MD  ibuprofen (ADVIL,MOTRIN) 800 MG tablet Take 1 tablet (800 mg total) by mouth every 8 (eight) hours as needed. 11/26/15   Bethann BerkshireJoseph Cipriana Biller, MD  metroNIDAZOLE (FLAGYL) 500 MG tablet Take 500 mg by mouth 2 (two) times daily. Reported on 11/26/2015 11/15/15   Historical Provider, MD   BP 135/64 mmHg  Pulse 56  Temp(Src) 98.2 F (36.8 C) (Oral)  Resp 14  SpO2 100%  LMP 11/01/2015 Physical Exam  Constitutional: She is oriented to person, place, and time. She appears well-developed.  HENT:  Head: Normocephalic.  Pharynx mildly inflamed  Eyes: Conjunctivae and EOM are normal.  Neck: Neck supple. No tracheal deviation present.  Cardiovascular: Normal rate and regular rhythm.   Musculoskeletal: Normal range of motion. She exhibits no edema.  Neurological: She is oriented to person, place, and time. Coordination normal.  Skin: Skin is warm.  Psychiatric: She has a normal mood and affect. Her behavior is normal.    ED Course  Procedures (including critical care time) Labs Review Labs Reviewed  RAPID STREP SCREEN (NOT [facility])  CULTURE, GROUP A STREP [facility])    Imaging Review No results found. I have personally reviewed and evaluated these images and lab results as part of my medical decision-making.   EKG Interpretation None      MDM   Final diagnoses:  Pharyngitis  Pharyngitis with tracheitis patient put on Z-Pak and will follow-up    Bethann Berkshire, MD 11/26/15 1310

## 2015-11-28 LAB — CULTURE, GROUP A STREP (THRC)

## 2015-12-16 HISTORY — PX: CERVIX LESION DESTRUCTION: SHX591

## 2016-02-23 ENCOUNTER — Ambulatory Visit: Payer: Self-pay | Admitting: Obstetrics

## 2016-03-07 ENCOUNTER — Ambulatory Visit (INDEPENDENT_AMBULATORY_CARE_PROVIDER_SITE_OTHER): Payer: Medicaid Other | Admitting: Obstetrics

## 2016-03-07 ENCOUNTER — Encounter: Payer: Self-pay | Admitting: Obstetrics

## 2016-03-07 ENCOUNTER — Ambulatory Visit: Payer: Self-pay | Admitting: Obstetrics

## 2016-03-07 VITALS — BP 144/91 | HR 61 | Ht 65.0 in

## 2016-03-07 DIAGNOSIS — Z9889 Other specified postprocedural states: Secondary | ICD-10-CM

## 2016-03-07 DIAGNOSIS — Z113 Encounter for screening for infections with a predominantly sexual mode of transmission: Secondary | ICD-10-CM | POA: Diagnosis not present

## 2016-03-07 DIAGNOSIS — R87613 High grade squamous intraepithelial lesion on cytologic smear of cervix (HGSIL): Secondary | ICD-10-CM

## 2016-03-07 NOTE — Progress Notes (Signed)
Patient ID: Gloria Pineshley S Baughman, female   DOB: Nov 06, 1990, 25 y.o.   MRN: [id number]  Chief Complaint  Patient presents with  . New Patient (Initial Visit)    follow up cryo from June, pt states she has vaginal tear that needs repaired.    HPI Gloria Chase is a 25 y.o. female.  S/P Cryo January 10, 2016.  Presents for follow up.  Concerned about vaginal laceration that healed but is open.  She has no pain in the area but cosmetically would like it closed. HPI  Past Medical History:  Diagnosis Date  . Anemia   . History of colposcopy with cervical biopsy 01/11/2011  . History of wisdom tooth extraction 2005  . Hx of hernia repair 2000  . Hypertension     Past Surgical History:  Procedure Laterality Date  . NO PAST SURGERIES      History reviewed. No pertinent family history.  Social History Social History  Substance Use Topics  . Smoking status: Never Smoker  . Smokeless tobacco: Never Used  . Alcohol use No    No Known Allergies  Current Outpatient Prescriptions  Medication Sig Dispense Refill  . hydrochlorothiazide (HYDRODIURIL) 12.5 MG tablet Take 12.5 mg by mouth daily.  2  . busPIRone (BUSPAR) 15 MG tablet Take 1 tablet by mouth 3 (three) times daily.  0  . ibuprofen (ADVIL,MOTRIN) 800 MG tablet Take 1 tablet (800 mg total) by mouth every 8 (eight) hours as needed. (Patient not taking: Reported on 03/07/2016) 15 tablet 0   No current facility-administered medications for this visit.     Review of Systems Review of Systems Constitutional: negative for fatigue and weight loss Respiratory: negative for cough and wheezing Cardiovascular: negative for chest pain, fatigue and palpitations Gastrointestinal: negative for abdominal pain and change in bowel habits Genitourinary:negative Integument/breast: negative for nipple discharge Musculoskeletal:negative for myalgias Neurological: negative for gait problems and tremors Behavioral/Psych: negative for abusive  relationship, depression Endocrine: negative for temperature intolerance     Blood pressure (!) 144/91, pulse 61, height 5\' 5"  (1.651 m), last menstrual period 02/25/2016.  Physical Exam Physical Exam General:   alert  Skin:   no rash or abnormalities  Lungs:   clear to auscultation bilaterally  Heart:   regular rate and rhythm, S1, S2 normal, no murmur, click, rub or gallop  Breasts:   normal without suspicious masses, skin or nipple changes or axillary nodes  Abdomen:  normal findings: no organomegaly, soft, non-tender and no hernia  Pelvis:  External genitalia: normal general appearance except small healed perineal laceration Urinary system: urethral meatus normal and bladder without fullness, nontender Vaginal: normal without tenderness, induration or masses Cervix: normal appearance Adnexa: normal bimanual exam Uterus: anteverted and non-tender, normal size      Data Reviewed Labs Pathology  Assessment     H/O HGSIL.  S/P Cryocautery.  No follow up done. Healed perineal laceration.  Wants repair.    Plan    Repeat pap at ~ 4 months post cryo. Will repair perineal laceration in the future after pap.   No orders of the defined types were placed in this encounter.  No orders of the defined types were placed in this encounter.

## 2016-03-10 ENCOUNTER — Other Ambulatory Visit: Payer: Self-pay | Admitting: Obstetrics

## 2016-03-10 DIAGNOSIS — N760 Acute vaginitis: Secondary | ICD-10-CM

## 2016-03-10 DIAGNOSIS — B9689 Other specified bacterial agents as the cause of diseases classified elsewhere: Secondary | ICD-10-CM

## 2016-03-10 LAB — NUSWAB VG+, CANDIDA 6SP
Atopobium vaginae: HIGH Score — AB
CANDIDA LUSITANIAE, NAA: NEGATIVE
CANDIDA PARAPSILOSIS, NAA: NEGATIVE
CHLAMYDIA TRACHOMATIS, NAA: NEGATIVE
Candida albicans, NAA: NEGATIVE
Candida glabrata, NAA: NEGATIVE
Candida krusei, NAA: NEGATIVE
Candida tropicalis, NAA: NEGATIVE
Neisseria gonorrhoeae, NAA: NEGATIVE
TRICH VAG BY NAA: NEGATIVE

## 2016-03-10 MED ORDER — METRONIDAZOLE 500 MG PO TABS: 500.0000 mg | ORAL_TABLET | Freq: Two times a day (BID) | ORAL | 2 refills | Status: DC

## 2016-06-06 ENCOUNTER — Encounter: Payer: Self-pay | Admitting: Obstetrics and Gynecology

## 2016-06-07 ENCOUNTER — Ambulatory Visit (INDEPENDENT_AMBULATORY_CARE_PROVIDER_SITE_OTHER): Payer: Medicaid Other | Admitting: Obstetrics and Gynecology

## 2016-06-07 ENCOUNTER — Encounter: Payer: Self-pay | Admitting: Obstetrics and Gynecology

## 2016-06-07 VITALS — BP 122/84 | HR 90 | Wt 244.0 lb

## 2016-06-07 DIAGNOSIS — Z9889 Other specified postprocedural states: Secondary | ICD-10-CM

## 2016-06-07 NOTE — Progress Notes (Signed)
Obstetrics and Gynecology Visit Return Patient Evaluation  [facility]  Appointment Date: 06/07/2016  Primary Care Provider: Leilani AbleEESE,Gloria Chase  Referring Provider: Dr. Clearance Chase  Chief Complaint: follow up pap smear  History of Present Illness:  Gloria Chase is a 25 y.o. G4P2 here for follow up cryo.  Looking at the noes and in talking to her, she states she had a pap smear with Dr. Gaynell Chase earlier this year which came back as LSIL. [name] but they stopped taking medicaid, but that she never had an abnormal pap with them.  She then had a colpo, which must've been done on paper records which I don't have access to, but the pathology showed a negative biopsy but with an ECC that showed a small focus of dysplasia; it didn't state that degree of dysplasia but it also saw benign EC cells.  Presumably, that colpo was adequate b/c she states she had cryo with Dr. Clearance Chase.   She was seen by Dr. Clearance Chase on 8/22 for cryo fllow up and to assess a vaginal tear.  His notes state that there is a small healed perineal laceration.  He stated at that time to have her follow up in 6832m to repair this laceration and repeat a pap at that time. Patient denies any pelvic pain or AUB.   Review of Systems: her 12 point review of systems is negative or as noted in the History of Present Illness.  There are no active problems to display for this patient.  Physical Exam:  BP 122/84   Pulse 90   Wt 244 lb (110.7 kg)   LMP 05/21/2016   BMI 40.60 kg/m  Body mass index is 40.6 kg/m. General appearance: Well nourished, well developed female in no acute distress. Neuro/Psych:  Normal mood and affect.    Assessment: pt doing well  Plan: I told her that based on the current asccp guidelines that if she had an LSIL pap and low grade colpo and then cryo, that I'Chase recommend repeat pap smear and hpv testing in one year. I'Chase recommend consideration doing an ECC  given there was a small foci of dysplasia on that ECC   Gloria Copaharlie Finlee Milo, Jr MD Attending [facility](Faculty Practice) .

## 2016-06-21 ENCOUNTER — Encounter: Payer: Self-pay | Admitting: Obstetrics & Gynecology

## 2016-06-21 ENCOUNTER — Encounter: Payer: Managed Care, Other (non HMO) | Admitting: Obstetrics & Gynecology

## 2016-06-21 NOTE — Progress Notes (Signed)
Patient is in the office to see if there is something that can be done to repair this. She feels it and it is uncomfortable.

## 2016-06-26 NOTE — Progress Notes (Signed)
This encounter was created in error - please disregard.

## 2016-07-03 ENCOUNTER — Ambulatory Visit: Payer: Medicaid Other | Admitting: Obstetrics and Gynecology

## 2016-07-26 ENCOUNTER — Ambulatory Visit (INDEPENDENT_AMBULATORY_CARE_PROVIDER_SITE_OTHER): Payer: Medicaid Other | Admitting: Obstetrics & Gynecology

## 2016-07-26 ENCOUNTER — Encounter: Payer: Self-pay | Admitting: Obstetrics & Gynecology

## 2016-07-26 VITALS — BP 137/89 | HR 101 | Ht 65.0 in | Wt 242.0 lb

## 2016-07-26 DIAGNOSIS — N9089 Other specified noninflammatory disorders of vulva and perineum: Secondary | ICD-10-CM | POA: Diagnosis not present

## 2016-07-26 NOTE — Progress Notes (Signed)
   GYNECOLOGY OFFICE VISIT NOTE  History:  26 y.o. [id number] here today for evaluation of vulvar lesion. Reports bothersome vulvar lesion that has been present since delivery and laceration repair after last pregnancy.  The lesion rubs on her underwear, and is very uncomfortable; cannot be tucked into the vagina.  She is worried about possible abnormal cells/neoplasia and wants this lesion biopsied/removed. This is cause of extreme anxiety for her.  She denies any abnormal vaginal discharge, bleeding, pelvic pain or other concerns.   Past Medical History:  Diagnosis Date  . Anemia   . History of colposcopy with cervical biopsy 01/11/2011  . History of trichomoniasis   . History of wisdom tooth extraction 2005  . Hx of hernia repair 2000  . Hypertension     Past Surgical History:  Procedure Laterality Date  . CERVIX LESION DESTRUCTION  12/2015    The following portions of the patient's history were reviewed and updated as appropriate: allergies, current medications, past family history, past medical history, past social history, past surgical history and problem list.   Health Maintenance:  LGSIL pap on 10/11/15, underwent cryotherapy on 01/10/16.    Review of Systems:  Pertinent items noted in HPI and remainder of comprehensive ROS otherwise negative.   Objective:  Physical Exam BP 137/89   Pulse (!) 101   Ht 5\' 5"  (1.651 m)   Wt 242 lb (109.8 kg)   LMP 07/17/2016 (Approximate)   BMI 40.27 kg/m  CONSTITUTIONAL: Well-developed, well-nourished female in no acute distress.  HENT:  Normocephalic, atraumatic. External right and left ear normal. Oropharynx is clear and moist EYES: Conjunctivae and EOM are normal. Pupils are equal, round, and reactive to light. No scleral icterus.  NECK: Normal range of motion, supple, no masses SKIN: Skin is warm and dry. No rash noted. Not diaphoretic. No erythema. No pallor. NEUROLOGIC: Alert and oriented to person, place, and time. Normal reflexes,  muscle tone coordination. No cranial nerve deficit noted. PSYCHIATRIC: Normal mood and affect. Normal behavior. Normal judgment and thought content. CARDIOVASCULAR: Normal heart rate noted RESPIRATORY: Effort and breath sounds normal, no problems with respiration noted ABDOMEN: Soft, no distention noted.   MUSCULOSKELETAL: Normal range of motion. No edema noted. PELVIC:  4 mm x 14 mm flesh-colored, soft lesion noted at introitus, on her left side. No erythema, no ulceration. No tenderness to palpation. No drainage.  Narrow stalk. Otherwise, normal anatomy.   Assessment & Plan:  Vulvar lesion Given persistence of lesion and patient's symptoms, recommended biopsy/removal under local analgesia in the office. Patient is agreeable to this plan. Discussed risks of bleeding, infection, injury to surrounding areas/organs, need for additional procedures.  Will schedule this as soon as possible and send specimen to pathology for analysis.  All questions answered. Reassured patient that this is very likely a benign lesion but will await final pathology results.   Return in about 1 week (around 08/02/2016) for Vulvar lesion removal/biopsy.   Total face-to-face time with patient: 25 minutes. Over 50% of encounter was spent on counseling and coordination of care.   Jaynie CollinsUGONNA  ANYANWU, MD, FACOG Attending Obstetrician & Gynecologist, [facility], [facility]

## 2016-07-26 NOTE — Progress Notes (Signed)
Patient is in the office for surgical consult, patient stated that she did not take BP med today.

## 2016-07-26 NOTE — Patient Instructions (Addendum)
Return to clinic for any scheduled appointments or for any gynecologic concerns as needed.   Vulva Biopsy A vulva biopsy is a procedure to remove a small sample of tissue from the vulva. The vulva is the outside part of the female genitals. The vulva includes the outside folds of skin (labia majora), the inner lips (labia minora), the clitoris, and the openings of the urethra and vagina. You may have this procedure to get more information about or diagnose a lesion, growth, rash, blister, or some other unusual discoloration. This procedure may also be done to remove a mole or wart. Tell a health care provider about:  Any allergies you have.  All medicines you are taking, including vitamins, herbs, eye drops, creams, and over-the-counter medicines.  Any problems you or family members have had with anesthetic medicines.  Any blood disorders you have.  Any surgeries you have had.  Any medical conditions you have.  Whether you are pregnant or may be pregnant. What are the risks? Generally, this is a safe procedure. However, problems may occur, including:  Infection.  Bleeding.  Allergic reactions to medicines.  Damage to other structures or organs.  Pain at the biopsy site. What happens before the procedure?  Wear loose and comfortable pants and underwear for the procedure.  Follow instructions from your health care provider about eating or drinking restrictions.  Ask your health care provider about:  Changing or stopping your regular medicines. This is especially important if you are taking diabetes medicines or blood thinners.  Taking medicines such as aspirin and ibuprofen. These medicines can thin your blood. Do not take these medicines before your procedure if your health care provider instructs you not to.  Ask your health care provider how your surgical site will be marked or identified.  You may be given antibiotic medicine to help prevent infection. What happens  during the procedure?  To reduce your risk of infection:  Your health care team will wash or sanitize their hands.  Your skin will be washed with soap.  You will be given a medicine to numb the area (local anesthetic).  A small tissue sample will be removed (excised). This sample may be sent for further examination depending on why you are having a biopsy.  A medicine may be applied to the biopsy site to help stop the bleeding.  The biopsy site may be closed with stitches (sutures). The procedure may vary among health care providers and hospitals. What happens after the procedure?  You may be given pain medicine.  If the sample is being sent for testing, it is your responsibility to get the results of your procedure. Ask your health care provider or the department performing the procedure when your results will be ready.  You may be given antibiotic ointment medicine. This information is not intended to replace advice given to you by your health care provider. Make sure you discuss any questions you have with your health care provider. Document Released: 06/19/2012 Document Revised: 12/15/2015 Document Reviewed: 05/24/2015 Elsevier Interactive Patient Education  2017 [employer]

## 2016-08-16 ENCOUNTER — Ambulatory Visit (INDEPENDENT_AMBULATORY_CARE_PROVIDER_SITE_OTHER): Payer: Medicaid Other | Admitting: Obstetrics & Gynecology

## 2016-08-16 ENCOUNTER — Encounter: Payer: Self-pay | Admitting: *Deleted

## 2016-08-16 ENCOUNTER — Encounter: Payer: Self-pay | Admitting: Obstetrics & Gynecology

## 2016-08-16 VITALS — BP 138/84 | HR 111 | Ht 65.0 in | Wt 244.0 lb

## 2016-08-16 DIAGNOSIS — N898 Other specified noninflammatory disorders of vagina: Secondary | ICD-10-CM

## 2016-08-16 MED ORDER — IBUPROFEN 800 MG PO TABS: 800.0000 mg | ORAL_TABLET | Freq: Three times a day (TID) | ORAL | 3 refills | Status: DC | PRN

## 2016-08-16 NOTE — Progress Notes (Signed)
   GYNECOLOGY OFFICE PROCEDURE NOTE  26 y.o. [id number] here today for vaginal lesion removal. She denies any abnormal vaginal discharge, bleeding, pelvic pain or other concerns.    VAGINAL BIOPSY NOTE The indications for biopsy (rule out neoplasia, or other abnormal conditions) were reviewed.   Risks of the biopsy including pain, bleeding, infection, inadequate specimen, scarring and need for additional procedures  were discussed. The patient stated understanding and agreed to undergo procedure today. Consent was signed,  time out performed.  The patient's introitus was prepped with Betadine. 1% lidocaine with epinephrine was injected into the base of the lesion.  Scalpel and and sterile scissors were used to excise the lesion.  Small bleeding was noted and hemostasis was achieved using silver nitrate sticks.  The base was reapproximated with 3-0 Vicryl interrupted sutures.  The patient tolerated the procedure well. Post-procedure instructions  (pelvic rest for two weeks) were given to the patient. The patient is to call with heavy bleeding, fever greater than 100.4, foul smelling vaginal discharge or other concerns.    The patient will be return to clinic in two-three weeks for discussion of pathology results and follow up.  Jaynie CollinsUGONNA  Masako Overall, MD, FACOG Attending Obstetrician & Gynecologist, [facility], [facility]

## 2016-08-16 NOTE — Patient Instructions (Signed)
Vulva Biopsy, Care After Introduction Refer to this sheet in the next few weeks. These instructions provide you with information about caring for yourself after your procedure. Your health care provider may also give you more specific instructions. Your treatment has been planned according to current medical practices, but problems sometimes occur. Call your health care provider if you have any problems or questions after your procedure. What can I expect after the procedure? After the procedure, it is common to have:  Slight bleeding from the biopsy site.  Discomfort at the biopsy site. Follow these instructions at home: Biopsy Site Care   Do not rub the biopsy area after urinating. Gently pat the area dry or use a bottle filled with warm water (peri-bottle) to clean the area. Gently wipe from front to back.  Follow instructions from your health care provider about how to take care of your biopsy site. Make sure you:  Clean the area using water and mild soap twice a day or as told by your health care provider. Gently pat the area dry.  If you were prescribed an antibiotic medical ointment, apply it as told by your health care provider. Do not stop using the antibiotic even if your condition improves.  Take a warm water bath that is taken while you are sitting down (sitz bath) as needed to help with pain or discomfort.  Leave stitches (sutures), skin glue, or adhesive strips in place. These skin closures may need to stay in place for 2 weeks or longer. If adhesive strip edges start to loosen and curl up, you may trim the loose edges. Do not remove adhesive strips completely unless your health care provider tells you to do that.  Check your biopsy site every day for signs of infection. Check for:  More redness, swelling, or pain.  More fluid or blood.  Warmth.  Pus or a bad smell. Lifestyle  Wear loose, cotton underwear. Do not wear tight pants.  Do not use a tampon, douche, or  put anything inside your vagina for at least one week or until your health care provider approves.  Do not have sex for at least one week or until your health care provider approves.  Do not exercise, such as running or biking, until your health care provider approves.  Do not take baths, swim, or use a hot tub until your health care provider approves. General instructions  Take over-the-counter and prescription medicines only as told by your health care provider.  Use a sanitary napkin until bleeding stops.  Keep all follow-up visits as told by your health care provider. This is important.  If the sample is being sent for testing, it is your responsibility to get the results of your procedure. Ask your health care provider or the department performing the procedure when your results will be ready. Contact a health care provider if:  You have more redness, swelling, or pain around your biopsy site.  You have more fluid or blood coming from your biopsy site.  Your biopsy site feels warm to the touch.  Your pain is not controlled with medicine. Get help right away if:  You have heavy bleeding from the vulva.  You have pus or a bad smell coming from your biopsy site.  You have a fever.  You have lower belly pain. This information is not intended to replace advice given to you by your health care provider. Make sure you discuss any questions you have with your health care provider. Document  Released: 06/19/2012 Document Revised: 12/09/2015 Document Reviewed: 05/24/2015  2017 Elsevier

## 2016-08-30 ENCOUNTER — Ambulatory Visit: Payer: Medicaid Other | Admitting: Obstetrics & Gynecology

## 2016-09-05 ENCOUNTER — Encounter: Payer: Self-pay | Admitting: Obstetrics & Gynecology

## 2016-09-05 ENCOUNTER — Ambulatory Visit: Payer: Medicaid Other | Admitting: Obstetrics & Gynecology

## 2016-09-05 ENCOUNTER — Ambulatory Visit (INDEPENDENT_AMBULATORY_CARE_PROVIDER_SITE_OTHER): Payer: Medicaid Other | Admitting: Obstetrics & Gynecology

## 2016-09-05 VITALS — BP 130/83 | HR 102 | Resp 20 | Wt 240.0 lb

## 2016-09-05 DIAGNOSIS — N898 Other specified noninflammatory disorders of vagina: Secondary | ICD-10-CM

## 2016-09-05 DIAGNOSIS — N912 Amenorrhea, unspecified: Secondary | ICD-10-CM | POA: Diagnosis not present

## 2016-09-05 DIAGNOSIS — Z3202 Encounter for pregnancy test, result negative: Secondary | ICD-10-CM | POA: Diagnosis not present

## 2016-09-05 LAB — POCT URINE PREGNANCY: PREG TEST UR: NEGATIVE

## 2016-09-05 NOTE — Progress Notes (Signed)
   GYNECOLOGY OFFICE VISIT NOTE  History:  26 y.o. [id number] here today for follow up after vaginal lesion excision 3 weeks ago. Patient has done well, no concerns.  She denies any abnormal vaginal discharge, bleeding, pelvic pain or other concerns.   Past Medical History:  Diagnosis Date  . Anemia   . History of colposcopy with cervical biopsy 01/11/2011  . History of trichomoniasis   . History of wisdom tooth extraction 2005  . Hx of hernia repair 2000  . Hypertension     Past Surgical History:  Procedure Laterality Date  . CERVIX LESION DESTRUCTION  12/2015    The following portions of the patient's history were reviewed and updated as appropriate: allergies, current medications, past family history, past medical history, past social history, past surgical history and problem list.   Health Maintenance:  LGSIL pap on 10/11/15, underwent cryotherapy on 01/10/16.     Review of Systems:  Pertinent items noted in HPI and remainder of comprehensive ROS otherwise negative.   Objective:  Physical Exam BP 130/83 (BP Location: Left Arm, Patient Position: Sitting, Cuff Size: Large)   Pulse (!) 102   Resp 20   Wt 240 lb (108.9 kg)   LMP 07/31/2016   BMI 39.94 kg/m  CONSTITUTIONAL: Well-developed, well-nourished female in no acute distress.  HENT:  Normocephalic, atraumatic. External right and left ear normal. Oropharynx is clear and moist EYES: Conjunctivae and EOM are normal. Pupils are equal, round, and reactive to light. No scleral icterus.  NECK: Normal range of motion, supple, no masses SKIN: Skin is warm and dry. No rash noted. Not diaphoretic. No erythema. No pallor. NEUROLOGIC: Alert and oriented to person, place, and time. Normal reflexes, muscle tone coordination. No cranial nerve deficit noted. PSYCHIATRIC: Normal mood and affect. Normal behavior. Normal judgment and thought content. CARDIOVASCULAR: Normal heart rate noted RESPIRATORY: Effort and breath sounds normal, no  problems with respiration noted ABDOMEN: Soft, no distention noted.   PELVIC: Normal appearing external genitalia; normal appearing vaginal mucosa. Well healed excision site, one protruding stitch removed.  No abnormal discharge noted. MUSCULOSKELETAL: Normal range of motion. No edema noted.  Labs and Imaging UPT: Negative Vaginal lesion pathology : Benign tissue  Assessment & Plan:  1. Amenorrhea Negative UPT.  Could be due to stress (patient reports undergoing a lot of stress this month). Will repeat home UPT if no period in 2 weeks. - POCT urine pregnancy  2. Vaginal lesion Benign pathology, patient reassured. Lifted pelvic rest restrictions  Routine preventative health maintenance measures emphasized; repeat pap due 12/2016. Please refer to After Visit Summary for other counseling recommendations.   Return if symptoms worsen or fail to improve or for any GYN concerns.   Total face-to-face time with patient: 15 minutes. Over 50% of encounter was spent on counseling and coordination of care.   Jaynie CollinsUGONNA  Shaely Gadberry, MD, FACOG Attending Obstetrician & Gynecologist, [facility], [facility]

## 2016-09-05 NOTE — Patient Instructions (Signed)
Return to clinic for any scheduled appointments or for any gynecologic concerns as needed.   

## 2016-11-23 ENCOUNTER — Encounter: Payer: Self-pay | Admitting: Obstetrics

## 2016-11-23 ENCOUNTER — Ambulatory Visit (INDEPENDENT_AMBULATORY_CARE_PROVIDER_SITE_OTHER): Payer: Medicaid Other | Admitting: Obstetrics

## 2016-11-23 ENCOUNTER — Other Ambulatory Visit (HOSPITAL_COMMUNITY)
Admission: RE | Admit: 2016-11-23 | Discharge: 2016-11-23 | Disposition: A | Discharge status: Home / Self Care | Payer: Medicaid Other | Source: Ambulatory Visit | Attending: Obstetrics | Admitting: Obstetrics

## 2016-11-23 VITALS — BP 134/93 | HR 58 | Ht 65.0 in | Wt 250.5 lb

## 2016-11-23 DIAGNOSIS — Z308 Encounter for other contraceptive management: Secondary | ICD-10-CM

## 2016-11-23 DIAGNOSIS — I10 Essential (primary) hypertension: Secondary | ICD-10-CM | POA: Insufficient documentation

## 2016-11-23 DIAGNOSIS — N898 Other specified noninflammatory disorders of vagina: Secondary | ICD-10-CM

## 2016-11-23 DIAGNOSIS — R87612 Low grade squamous intraepithelial lesion on cytologic smear of cervix (LGSIL): Secondary | ICD-10-CM | POA: Insufficient documentation

## 2016-11-23 DIAGNOSIS — Z124 Encounter for screening for malignant neoplasm of cervix: Secondary | ICD-10-CM

## 2016-11-23 DIAGNOSIS — Z0000 Encounter for general adult medical examination without abnormal findings: Secondary | ICD-10-CM

## 2016-11-23 DIAGNOSIS — L738 Other specified follicular disorders: Secondary | ICD-10-CM

## 2016-11-23 DIAGNOSIS — Z113 Encounter for screening for infections with a predominantly sexual mode of transmission: Secondary | ICD-10-CM

## 2016-11-23 DIAGNOSIS — Z01419 Encounter for gynecological examination (general) (routine) without abnormal findings: Secondary | ICD-10-CM

## 2016-11-23 DIAGNOSIS — Z9889 Other specified postprocedural states: Secondary | ICD-10-CM | POA: Insufficient documentation

## 2016-11-23 MED ORDER — CLINDAMYCIN PHOSPHATE 1 % EX SOLN: Freq: Two times a day (BID) | CUTANEOUS | 4 refills | Status: DC

## 2016-11-23 NOTE — Patient Instructions (Addendum)
Folliculitis Folliculitis is inflammation of the hair follicles. Folliculitis most commonly occurs on the scalp, thighs, legs, back, and buttocks. However, it can occur anywhere on the body. What are the causes? This condition may be caused by:  A bacterial infection (common).  A fungal infection.  A viral infection.  Coming into contact with certain chemicals, especially oils and tars.  Shaving or waxing.  Applying greasy ointments or creams to your skin often. Long-lasting folliculitis and folliculitis that keeps coming back can be caused by bacteria that live in the nostrils. What increases the risk? This condition is more likely to develop in people with:  A weakened immune system.  Diabetes.  Obesity. What are the signs or symptoms? Symptoms of this condition include:  Redness.  Soreness.  Swelling.  Itching.  Small white or yellow, pus-filled, itchy spots (pustules) that appear over a reddened area. If there is an infection that goes deep into the follicle, these may develop into a boil (furuncle).  A group of closely packed boils (carbuncle). These tend to form in hairy, sweaty areas of the body. How is this diagnosed? This condition is diagnosed with a skin exam. To find what is causing the condition, your health care provider may take a sample of one of the pustules or boils for testing. How is this treated? This condition may be treated by:  Applying warm compresses to the affected areas.  Taking an antibiotic medicine or applying an antibiotic medicine to the skin.  Applying or bathing with an antiseptic solution.  Taking an over-the-counter medicine to help with itching.  Having a procedure to drain any pustules or boils. This may be done if a pustule or boil contains a lot of pus or fluid.  Laser hair removal. This may be done to treat long-lasting folliculitis. Follow these instructions at home:  If directed, apply heat to the affected area as  often as told by your health care provider. Use the heat source that your health care provider recommends, such as a moist heat pack or a heating pad.  Place a towel between your skin and the heat source.  Leave the heat on for 20-30 minutes.  Remove the heat if your skin turns bright red. This is especially important if you are unable to feel pain, heat, or cold. You may have a greater risk of getting burned.  If you were prescribed an antibiotic medicine, use it as told by your health care provider. Do not stop using the antibiotic even if you start to feel better.  Take over-the-counter and prescription medicines only as told by your health care provider.  Do not shave irritated skin.  Keep all follow-up visits as told by your health care provider. This is important. Get help right away if:  You have more redness, swelling, or pain in the affected area.  Red streaks are spreading from the affected area.  You have a fever. This information is not intended to replace advice given to you by your health care provider. Make sure you discuss any questions you have with your health care provider. Document Released: 09/11/2001 Document Revised: 01/21/2016 Document Reviewed: 04/23/2015 Elsevier Interactive Patient Education  2017 Elsevier Inc.  

## 2016-11-23 NOTE — Progress Notes (Signed)
Subjective:        Gloria Chase is a 26 y.o. female here for a routine exam.  Current complaints: None.    Personal health questionnaire:  Is patient Gloria Chase, have a family history of breast and/or ovarian cancer: no Is there a family history of uterine cancer diagnosed at age < 27, gastrointestinal cancer, urinary tract cancer, family member who is a Personnel officer syndrome-associated carrier: no Is the patient overweight and hypertensive, family history of diabetes, personal history of gestational diabetes, preeclampsia or PCOS: no Is patient over 59, have PCOS,  family history of premature CHD under age 32, diabetes, smoke, have hypertension or peripheral artery disease:  no At any time, has a partner hit, kicked or otherwise hurt or frightened you?: no Over the past 2 weeks, have you felt down, depressed or hopeless?: no Over the past 2 weeks, have you felt little interest or pleasure in doing things?:no   Gynecologic History No LMP recorded. Contraception: none Last Pap: 2017. Results were: abnormal Last mammogram: n/a. Results were: n/a  Obstetric History OB History  Gravida Para Term Preterm AB Living  4 2 2  0 2 2  SAB TAB Ectopic Multiple Live Births  0 1 1 0 2    # Outcome Date GA Lbr Len/2nd Weight Sex Delivery Anes PTL Lv  4 Term 02/18/11 [redacted]w[redacted]d 10:50 / 00:25 9 lb 1.7 oz (4.131 kg) M Vag-Spont EPI  LIV  3 Ectopic 02/2010          2 Term 03/2007    F Vag-Spont EPI  LIV  1 TAB 04/2006              Past Medical History:  Diagnosis Date  . Anemia   . History of colposcopy with cervical biopsy 01/11/2011  . History of trichomoniasis   . History of wisdom tooth extraction 2005  . Hx of hernia repair 2000  . Hypertension     Past Surgical History:  Procedure Laterality Date  . CERVIX LESION DESTRUCTION  12/2015     Current Outpatient Prescriptions:  .  hydrochlorothiazide (HYDRODIURIL) 12.5 MG tablet, Take 12.5 mg by mouth daily., Disp: , Rfl: 2 No  Known Allergies  Social History  Substance Use Topics  . Smoking status: Never Smoker  . Smokeless tobacco: Never Used  . Alcohol use No    History reviewed. No pertinent family history.    Review of Systems  Constitutional: negative for fatigue and weight loss Respiratory: negative for cough and wheezing Cardiovascular: negative for chest pain, fatigue and palpitations Gastrointestinal: negative for abdominal pain and change in bowel habits Musculoskeletal:negative for myalgias Neurological: negative for gait problems and tremors Behavioral/Psych: negative for abusive relationship, depression Endocrine: negative for temperature intolerance    Genitourinary:negative for abnormal menstrual periods, genital lesions, hot flashes, sexual problems and vaginal discharge Integument/breast: negative for breast lump, breast tenderness, nipple discharge and skin lesion(s)    Objective:       There were no vitals taken for this visit. General:   alert  Skin:   no rash or abnormalities  Lungs:   clear to auscultation bilaterally  Heart:   regular rate and rhythm, S1, S2 normal, no murmur, click, rub or gallop  Breasts:   normal without suspicious masses, skin or nipple changes or axillary nodes  Abdomen:  normal findings: no organomegaly, soft, non-tender and no hernia  Pelvis:  External genitalia: normal general appearance Urinary system: urethral meatus normal and bladder without fullness, nontender  Vaginal: normal without tenderness, induration or masses Cervix: normal appearance Adnexa: normal bimanual exam Uterus: anteverted and non-tender, normal size   Lab Review Urine pregnancy test Labs reviewed yes Radiologic studies reviewed no  50% of 20 min visit spent on counseling and coordination of care.    Assessment:    Healthy female exam.    History of LGSI on positive ECC foci.   S/P Cryocautery.  Contraceptive Counseling and Advice.  Declines contraception at this  time.   Plan:    Education reviewed: calcium supplements, depression evaluation, low fat, low cholesterol diet, safe sex/STD prevention, self breast exams and weight bearing exercise. Contraception: none. Follow up in: 1 year.   No orders of the defined types were placed in this encounter.  No orders of the defined types were placed in this encounter.    Patient ID: Gloria Chase, female   DOB: [birth year], 26 y.o.   MRN: [id number]

## 2016-11-23 NOTE — Progress Notes (Signed)
Pt presents for Annual, pap, and STD blood work. Pt has history of LGSIL/CIN1 on pap 09/2015. Benign Colpo bx 11/2015. S/P Cryo 12/2015.

## 2016-11-24 LAB — RPR: RPR: NONREACTIVE

## 2016-11-24 LAB — CYTOLOGY - PAP
DIAGNOSIS: NEGATIVE
HPV (WINDOPATH): NOT DETECTED

## 2016-11-24 LAB — CERVICOVAGINAL ANCILLARY ONLY
Bacterial vaginitis: POSITIVE — AB
CHLAMYDIA, DNA PROBE: NEGATIVE
Candida vaginitis: NEGATIVE
NEISSERIA GONORRHEA: NEGATIVE
TRICH (WINDOWPATH): NEGATIVE

## 2016-11-24 LAB — HIV ANTIBODY (ROUTINE TESTING W REFLEX): HIV SCREEN 4TH GENERATION: NONREACTIVE

## 2016-11-24 LAB — HEPATITIS C ANTIBODY: Hep C Virus Ab: <0.1 s/co ratio (ref 0.0–0.9)

## 2016-11-24 LAB — HEPATITIS B SURFACE ANTIGEN: Hepatitis B Surface Ag: NEGATIVE

## 2016-11-27 ENCOUNTER — Other Ambulatory Visit: Payer: Self-pay | Admitting: Obstetrics

## 2016-11-27 DIAGNOSIS — B9689 Other specified bacterial agents as the cause of diseases classified elsewhere: Secondary | ICD-10-CM

## 2016-11-27 DIAGNOSIS — N760 Acute vaginitis: Secondary | ICD-10-CM

## 2016-11-27 MED ORDER — METRONIDAZOLE 500 MG PO TABS: 500.0000 mg | ORAL_TABLET | Freq: Two times a day (BID) | ORAL | 2 refills | Status: DC

## 2017-05-25 ENCOUNTER — Encounter: Payer: Self-pay | Admitting: Nurse Practitioner

## 2017-06-01 ENCOUNTER — Ambulatory Visit: Payer: Self-pay | Admitting: Medical

## 2017-06-01 ENCOUNTER — Other Ambulatory Visit (HOSPITAL_COMMUNITY)
Admission: RE | Admit: 2017-06-01 | Discharge: 2017-06-01 | Disposition: A | Discharge status: Home / Self Care | Payer: Medicaid Other | Source: Ambulatory Visit | Attending: Medical | Admitting: Medical

## 2017-06-01 ENCOUNTER — Encounter: Payer: Self-pay | Admitting: Medical

## 2017-06-01 ENCOUNTER — Ambulatory Visit: Payer: Self-pay | Admitting: Clinical

## 2017-06-01 VITALS — BP 151/95 | HR 78 | Ht 66.0 in | Wt 248.0 lb

## 2017-06-01 DIAGNOSIS — Z113 Encounter for screening for infections with a predominantly sexual mode of transmission: Secondary | ICD-10-CM

## 2017-06-01 DIAGNOSIS — F419 Anxiety disorder, unspecified: Secondary | ICD-10-CM

## 2017-06-01 NOTE — Progress Notes (Signed)
Patient [facility] about presenting concerns. Gloria NippleJulie Wenzel aware

## 2017-06-01 NOTE — Progress Notes (Signed)
History:  Gloria Chase is a 26 y.o. [id number] who presents to clinic today for STD testing. The patient had a normal pap smear in May 2018. She had a new partner starting this past July. She has been with him off and on since then. Sometimes uses condoms. She may be interested in birth control. She will read more about the options and make an appointment to initiate son. She is also having issues with anxiety [facility]    The following portions of the patient's history were reviewed and updated as appropriate: allergies, current medications, family history, past medical history, social history, past surgical history and problem list.  Review of Systems:  Review of Systems  Constitutional: Negative for fever.  Gastrointestinal: Negative for abdominal pain.  Genitourinary: Negative for dysuria, frequency and urgency.       + discharge, white, mild odor      Objective:  Physical Exam BP (!) 151/95   Pulse 78   Ht 5\' 6"  (1.676 m)   Wt 248 lb (112.5 kg)   LMP 05/25/2017 (Exact Date)   BMI 40.03 kg/m  Physical Exam  Constitutional: She is oriented to person, place, and time. She appears well-developed and well-nourished. No distress.  HENT:  Head: Normocephalic.  Cardiovascular: Normal rate.  Pulmonary/Chest: Effort normal.  Abdominal: Soft.  Neurological: She is alert and oriented to person, place, and time.  Skin: Skin is warm and dry. No erythema.  Psychiatric: She has a normal mood and affect.  Vitals reviewed.   Assessment & Plan:  1. Screen for STD (sexually transmitted disease) - RPR - HIV antibody (with reflex) - Hepatitis C Antibody - Hepatitis B Surface AntiGEN - Cervicovaginal ancillary only  2. Anxiety - [facility]   Kathlene CoteWenzel, Julie N, PA-C 06/01/2017 12:09 PM

## 2017-06-01 NOTE — Patient Instructions (Addendum)
Safe Sex Practicing safe sex means taking steps before and during sex to reduce your risk of:  Getting an STD (sexually transmitted disease).  Giving your partner an STD.  Unwanted pregnancy.  How can I practice safe sex?  To practice safe sex:  Limit your sexual partners to only one partner who is having sex with only you.  Avoid using alcohol and recreational drugs before having sex. These substances can affect your judgment.  Before having sex with a new partner: ? Talk to your partner about past partners, past STDs, and drug use. ? You and your partner should be screened for STDs and discuss the results with each other.  Check your body regularly for sores, blisters, rashes, or unusual discharge. If you notice any of these problems, visit your health care provider.  If you have symptoms of an infection or you are being treated for an STD, avoid sexual contact.  While having sex, use a condom. Make sure to: ? Use a condom every time you have vaginal, oral, or anal sex. Both females and males should wear condoms during oral sex. ? Keep condoms in place from the beginning to the end of sexual activity. ? Use a latex condom, if possible. Latex condoms offer the best protection. ? Use only water-based lubricants or oils to lubricate a condom. Using petroleum-based lubricants or oils will weaken the condom and increase the chance that it will break.  See your health care provider for regular screenings, exams, and tests for STDs.  Talk with your health care provider about the form of birth control (contraception) that is best for you.  Get vaccinated against hepatitis B and human papillomavirus (HPV).  If you are at risk of being infected with HIV (human immunodeficiency virus), talk with your health care provider about taking a prescription medicine to prevent HIV infection. You are considered at risk for HIV if: ? You are a man who has sex with other men. ? You are a  heterosexual man or woman who is sexually active with more than one partner. ? You take drugs by injection. ? You are sexually active with a partner who has HIV.  This information is not intended to replace advice given to you by your health care provider. Make sure you discuss any questions you have with your health care provider. Document Released: 08/10/2004 Document Revised: 11/17/2015 Document Reviewed: 05/23/2015 Elsevier Interactive Patient Education  2018 [employer] Contraception Choices Contraception, also called birth control, means things to use or ways to try not to get pregnant. Hormonal birth control This kind of birth control uses hormones. Here are some types of hormonal birth control: A tube that is put under skin of the arm (implant). The tube can stay in for as long as 3 years. Shots to get every 3 months (injections). Pills to take every day (birth control pills). A patch to change 1 time each week for 3 weeks (birth control patch). After that, the patch is taken off for 1 week. A ring to put in the vagina. The ring is left in for 3 weeks. Then it is taken out of the vagina for 1 week. Then a new ring is put in. Pills to take after unprotected sex (emergency birth control pills).  Barrier birth control Here are some types of barrier birth control: A thin covering that is put on the penis before sex (female condom). The covering is thrown away after sex. A soft, loose covering that is put in  the vagina before sex (female condom). The covering is thrown away after sex. A rubber bowl that sits over the cervix (diaphragm). The bowl must be made for you. The bowl is put into the vagina before sex. The bowl is left in for 6-8 hours after sex. It is taken out within 24 hours. A small, soft cup that fits over the cervix (cervical cap). The cup must be made for you. The cup can be left in for 6-8 hours after sex. It is taken out within 48 hours. A sponge that is put into the  vagina before sex. It must be left in for at least 6 hours after sex. It must be taken out within 30 hours. Then it is thrown away. A chemical that kills or stops sperm from getting into the uterus (spermicide). It may be a pill, cream, jelly, or foam to put in the vagina. The chemical should be used at least 10-15 minutes before sex.  IUD (intrauterine) birth control An IUD is a small, T-shaped piece of plastic. It is put inside the uterus. There are two kinds: Hormone IUD. This kind can stay in for 3-5 years. Copper IUD. This kind can stay in for 10 years.  Permanent birth control Here are some types of permanent birth control: Surgery to block the fallopian tubes. Having an insert put into each fallopian tube. Surgery to tie off the tubes that carry sperm (vasectomy).  Natural planning birth control Here are some types of natural planning birth control: Not having sex on the days the woman could get pregnant. Using a calendar: To keep track of the length of each period. To find out what days pregnancy can happen. To plan to not have sex on days when pregnancy can happen. Watching for symptoms of ovulation and not having sex during ovulation. One way the woman can check for ovulation is to check her temperature. Waiting to have sex until after ovulation.  Summary Contraception, also called birth control, means things to use or ways to try not to get pregnant. Hormonal methods of birth control include implants, injections, pills, patches, vaginal rings, and emergency birth control pills. Barrier methods of birth control can include female condoms, female condoms, diaphragms, cervical caps, sponges, and spermicides. There are two types of IUD (intrauterine device) birth control. An IUD can be put in a woman's uterus to prevent pregnancy for 3-5 years. Permanent sterilization can be done through a procedure for males, females, or both. Natural planning methods involve not having sex on the  days when the woman could get pregnant. This information is not intended to replace advice given to you by your health care provider. Make sure you discuss any questions you have with your health care provider. Document Released: 04/30/2009 Document Revised: 07/13/2016 Document Reviewed: 07/13/2016 Elsevier Interactive Patient Education  2017 [employer]

## 2017-06-01 NOTE — BH Specialist Note (Signed)
error 

## 2017-06-02 LAB — HEPATITIS B SURFACE ANTIGEN: HEP B S AG: NEGATIVE

## 2017-06-02 LAB — HEPATITIS C ANTIBODY

## 2017-06-02 LAB — RPR: RPR Ser Ql: NONREACTIVE

## 2017-06-02 LAB — HIV ANTIBODY (ROUTINE TESTING W REFLEX): HIV SCREEN 4TH GENERATION: NONREACTIVE

## 2017-06-04 LAB — CERVICOVAGINAL ANCILLARY ONLY
Bacterial vaginitis: POSITIVE — AB
CANDIDA VAGINITIS: NEGATIVE
Chlamydia: NEGATIVE
NEISSERIA GONORRHEA: NEGATIVE
Trichomonas: NEGATIVE

## 2017-06-05 ENCOUNTER — Other Ambulatory Visit: Payer: Self-pay | Admitting: Medical

## 2017-06-05 DIAGNOSIS — B9689 Other specified bacterial agents as the cause of diseases classified elsewhere: Secondary | ICD-10-CM

## 2017-06-05 DIAGNOSIS — N760 Acute vaginitis: Secondary | ICD-10-CM

## 2017-06-05 MED ORDER — METRONIDAZOLE 500 MG PO TABS: 500.0000 mg | ORAL_TABLET | Freq: Two times a day (BID) | ORAL | 0 refills | Status: DC

## 2017-07-24 ENCOUNTER — Institutional Professional Consult (permissible substitution): Payer: Self-pay

## 2017-07-24 ENCOUNTER — Telehealth: Payer: Self-pay | Admitting: Medical

## 2017-07-24 NOTE — Telephone Encounter (Signed)
Called patient and had to leave a message about her appointment.

## 2017-07-24 NOTE — BH Specialist Note (Deleted)
Integrated Behavioral Health Initial Visit  MRN: [id number] Name: Ernest Pineshley S Ozturk  Number of Integrated Behavioral Health Clinician visits:: 1/6 Session Start time: ***  Session End time: *** Total time: {IBH Total Time:21014050}  Type of Service: Integrated Behavioral Health- Individual/Family Interpretor:No. Interpretor Name and Language: n/a   Warm Hand Off Completed.       SUBJECTIVE: Ernest Pineshley S Sacra is a 27 y.o. female accompanied by {CHL AMB ACCOMPANIED XL:[id number]}[phone]} Patient was referred by Vonzella NippleJulie Wenzel, PA-C for anxiety. Patient reports the following symptoms/concerns: Pt states her primary concern today is *** Duration of problem: ***; Severity of problem: {Mild/Moderate/Severe:20260}  OBJECTIVE: Mood: {BHH MOOD:22306} and Affect: {BHH AFFECT:22307} Risk of harm to self or others: {CHL AMB BH Suicide Current Mental Status:21022748}  LIFE CONTEXT: Family and Social: *** School/Work: *** Self-Care: *** Life Changes: ***  GOALS ADDRESSED: Patient will: 1. Reduce symptoms of: {IBH Symptoms:21014056} 2. Increase knowledge and/or ability of: {IBH Patient Tools:21014057}  3. Demonstrate ability to: {IBH Goals:21014053}  INTERVENTIONS: Interventions utilized: {IBH Interventions:21014054}  Standardized Assessments completed: {IBH Screening Tools:21014051}  ASSESSMENT: Patient currently experiencing ***.   Patient may benefit from psychoeducation and brief therapeutic intervention regarding coping with symptoms of ***.  PLAN: 1. Follow up with behavioral health clinician on : *** 2. Behavioral recommendations:  -*** -*** -Read educational materials regarding coping with symptoms of ***  3. Referral(s): {IBH Referrals:21014055} 4. "From scale of 1-10, how likely are you to follow plan?": ***  Valetta CloseJamie C McMannes, LCSW

## 2018-02-14 ENCOUNTER — Other Ambulatory Visit (HOSPITAL_COMMUNITY)
Admission: RE | Admit: 2018-02-14 | Discharge: 2018-02-14 | Disposition: A | Discharge status: Home / Self Care | Payer: BLUE CROSS/BLUE SHIELD | Source: Ambulatory Visit | Attending: Obstetrics | Admitting: Obstetrics

## 2018-02-14 ENCOUNTER — Ambulatory Visit (INDEPENDENT_AMBULATORY_CARE_PROVIDER_SITE_OTHER): Payer: BLUE CROSS/BLUE SHIELD | Admitting: Obstetrics

## 2018-02-14 ENCOUNTER — Encounter: Payer: Self-pay | Admitting: Obstetrics

## 2018-02-14 VITALS — Ht 65.0 in | Wt 249.4 lb

## 2018-02-14 DIAGNOSIS — Z113 Encounter for screening for infections with a predominantly sexual mode of transmission: Secondary | ICD-10-CM

## 2018-02-14 DIAGNOSIS — N898 Other specified noninflammatory disorders of vagina: Secondary | ICD-10-CM

## 2018-02-14 DIAGNOSIS — Z01419 Encounter for gynecological examination (general) (routine) without abnormal findings: Secondary | ICD-10-CM

## 2018-02-14 DIAGNOSIS — Z01411 Encounter for gynecological examination (general) (routine) with abnormal findings: Secondary | ICD-10-CM | POA: Diagnosis not present

## 2018-02-14 DIAGNOSIS — R87612 Low grade squamous intraepithelial lesion on cytologic smear of cervix (LGSIL): Secondary | ICD-10-CM

## 2018-02-14 DIAGNOSIS — I10 Essential (primary) hypertension: Secondary | ICD-10-CM

## 2018-02-14 DIAGNOSIS — Z6841 Body Mass Index (BMI) 40.0 and over, adult: Secondary | ICD-10-CM

## 2018-02-14 DIAGNOSIS — N944 Primary dysmenorrhea: Secondary | ICD-10-CM

## 2018-02-14 MED ORDER — TRIAMTERENE-HCTZ 37.5-25 MG PO CAPS: 1.0000 | ORAL_CAPSULE | Freq: Every day | ORAL | 11 refills | Status: DC

## 2018-02-14 MED ORDER — CARVEDILOL 12.5 MG PO TABS: 12.5000 mg | ORAL_TABLET | Freq: Two times a day (BID) | ORAL | 11 refills | Status: DC

## 2018-02-14 MED ORDER — IBUPROFEN 800 MG PO TABS: 800.0000 mg | ORAL_TABLET | Freq: Three times a day (TID) | ORAL | 5 refills | Status: DC | PRN

## 2018-02-14 NOTE — Progress Notes (Signed)
Subjective:        Gloria Chase is a 27 y.o. female here for a routine exam.  Current complaints: None.    Personal health questionnaire:  Is patient [name], have a family history of breast and/or ovarian cancer: no Is there a family history of uterine cancer diagnosed at age < 7150, gastrointestinal cancer, urinary tract cancer, family member who is a Personnel officerLynch syndrome-associated carrier: no Is the patient overweight and hypertensive, family history of diabetes, personal history of gestational diabetes, preeclampsia or PCOS: no Is patient over 4055, have PCOS,  family history of premature CHD under age 29, diabetes, smoke, have hypertension or peripheral artery disease:  no At any time, has a partner hit, kicked or otherwise hurt or frightened you?: no Over the past 2 weeks, have you felt down, depressed or hopeless?: no Over the past 2 weeks, have you felt little interest or pleasure in doing things?:no   Gynecologic History Patient's last menstrual period was 01/26/2018 (lmp unknown). Contraception: none Last Pap: 2018. Results were: normal Last mammogram: n/a. Results were: n/a  Obstetric History OB History  Gravida Para Term Preterm AB Living  4 2 2  0 2 2  SAB TAB Ectopic Multiple Live Births  1 0 1 0 2    # Outcome Date GA Lbr Len/2nd Weight Sex Delivery Anes PTL Lv  4 Term 02/18/11 1956w3d 10:50 / 00:25 9 lb 1.7 oz (4.131 kg) M Vag-Spont EPI  LIV  3 Ectopic 02/2010          2 Term 03/2007    F Vag-Spont EPI  LIV  1 SAB 04/2006            Past Medical History:  Diagnosis Date  . Anemia   . History of colposcopy with cervical biopsy 01/11/2011  . History of trichomoniasis   . History of wisdom tooth extraction 2005  . Hx of hernia repair 2000  . Hypertension     Past Surgical History:  Procedure Laterality Date  . CERVIX LESION DESTRUCTION  12/2015     Current Outpatient Medications:  Marland Kitchen.  Multiple Vitamin (MULTIVITAMIN) capsule, Take 1 capsule daily  by mouth., Disp: , Rfl:  .  carvedilol (COREG) 12.5 MG tablet, Take 1 tablet (12.5 mg total) by mouth 2 (two) times daily with a meal., Disp: 60 tablet, Rfl: 11 .  hydrochlorothiazide (HYDRODIURIL) 12.5 MG tablet, Take 12.5 mg by mouth daily., Disp: , Rfl: 2 .  ibuprofen (ADVIL,MOTRIN) 800 MG tablet, Take 1 tablet (800 mg total) by mouth every 8 (eight) hours as needed for cramping., Disp: 60 tablet, Rfl: 5 .  triamterene-hydrochlorothiazide (DYAZIDE) 37.5-25 MG capsule, Take 1 each (1 capsule total) by mouth daily., Disp: 30 capsule, Rfl: 11 No Known Allergies  Social History   Tobacco Use  . Smoking status: Never Smoker  . Smokeless tobacco: Never Used  Substance Use Topics  . Alcohol use: Yes    Comment: social    Family History  Problem Relation Age of Onset  . Hypertension Mother   . Hypertension Father   . Hypertension Maternal Grandmother   . Hypertension Maternal Grandfather   . Colon cancer Maternal Grandfather       Review of Systems  Constitutional: negative for fatigue and weight loss Respiratory: negative for cough and wheezing Cardiovascular: negative for chest pain, fatigue and palpitations Gastrointestinal: negative for abdominal pain and change in bowel habits Musculoskeletal:negative for myalgias Neurological: negative for gait problems and tremors Behavioral/Psych: negative for  abusive relationship, depression Endocrine: negative for temperature intolerance    Genitourinary:negative for abnormal menstrual periods, genital lesions, hot flashes, sexual problems and vaginal discharge Integument/breast: negative for breast lump, breast tenderness, nipple discharge and skin lesion(s)    Objective:       Ht 5\' 5"  (1.651 m)   Wt 249 lb 6.4 oz (113.1 kg)   LMP 01/26/2018 (LMP Unknown)   BMI 41.50 kg/m  General:   alert  Skin:   no rash or abnormalities  Lungs:   clear to auscultation bilaterally  Heart:   regular rate and rhythm, S1, S2 normal, no murmur,  click, rub or gallop  Breasts:   normal without suspicious masses, skin or nipple changes or axillary nodes  Abdomen:  normal findings: no organomegaly, soft, non-tender and no hernia  Pelvis:  External genitalia: normal general appearance Urinary system: urethral meatus normal and bladder without fullness, nontender Vaginal: normal without tenderness, induration or masses Cervix: normal appearance Adnexa: normal bimanual exam Uterus: anteverted and non-tender, normal size   Lab Review Urine pregnancy test Labs reviewed yes Radiologic studies reviewed no  50% of 20 min visit spent on counseling and coordination of care.   Assessment:    Healthy female exam.    Plan:    Education reviewed: calcium supplements, depression evaluation, low fat, low cholesterol diet, safe sex/STD prevention, self breast exams and weight bearing exercise. Contraception: none. Follow up in: 1 year.   Meds ordered this encounter  Medications  . triamterene-hydrochlorothiazide (DYAZIDE) 37.5-25 MG capsule    Sig: Take 1 each (1 capsule total) by mouth daily.    Dispense:  30 capsule    Refill:  11  . carvedilol (COREG) 12.5 MG tablet    Sig: Take 1 tablet (12.5 mg total) by mouth 2 (two) times daily with a meal.    Dispense:  60 tablet    Refill:  11  . ibuprofen (ADVIL,MOTRIN) 800 MG tablet    Sig: Take 1 tablet (800 mg total) by mouth every 8 (eight) hours as needed for cramping.    Dispense:  60 tablet    Refill:  5   Orders Placed This Encounter  Procedures  . Hepatitis B surface antigen  . Hepatitis C antibody  . HIV antibody  . RPR    Brock Bad MD 02-14-2018

## 2018-02-14 NOTE — Progress Notes (Signed)
Pt here for AEX.

## 2018-02-15 ENCOUNTER — Other Ambulatory Visit: Payer: Self-pay | Admitting: Obstetrics

## 2018-02-15 DIAGNOSIS — A549 Gonococcal infection, unspecified: Secondary | ICD-10-CM

## 2018-02-15 DIAGNOSIS — B9689 Other specified bacterial agents as the cause of diseases classified elsewhere: Secondary | ICD-10-CM

## 2018-02-15 DIAGNOSIS — N760 Acute vaginitis: Secondary | ICD-10-CM

## 2018-02-15 LAB — HIV ANTIBODY (ROUTINE TESTING W REFLEX): HIV Screen 4th Generation wRfx: NONREACTIVE

## 2018-02-15 LAB — RPR: RPR Ser Ql: NONREACTIVE

## 2018-02-15 LAB — CERVICOVAGINAL ANCILLARY ONLY
Bacterial vaginitis: POSITIVE — AB
CANDIDA VAGINITIS: NEGATIVE
Chlamydia: NEGATIVE
Neisseria Gonorrhea: POSITIVE — AB
TRICH (WINDOWPATH): NEGATIVE

## 2018-02-15 LAB — HEPATITIS B SURFACE ANTIGEN: Hepatitis B Surface Ag: NEGATIVE

## 2018-02-15 LAB — HEPATITIS C ANTIBODY: Hep C Virus Ab: <0.1 s/co ratio (ref 0.0–0.9)

## 2018-02-15 MED ORDER — AZITHROMYCIN 500 MG PO TABS: 1000.0000 mg | ORAL_TABLET | Freq: Once | ORAL | 0 refills | Status: AC

## 2018-02-15 MED ORDER — CEFTRIAXONE SODIUM 250 MG IJ SOLR
250.0000 mg | Freq: Once | INTRAMUSCULAR | Status: AC
Start: 2018-02-15 — End: 2018-02-22
  Administered 2018-02-22: 250 mg via INTRAMUSCULAR

## 2018-02-15 MED ORDER — TINIDAZOLE 500 MG PO TABS
1000.0000 mg | ORAL_TABLET | Freq: Every day | ORAL | 2 refills | Status: DC
Start: 2018-02-15 — End: 2019-03-05

## 2018-02-19 LAB — CYTOLOGY - PAP: DIAGNOSIS: NEGATIVE

## 2018-02-22 ENCOUNTER — Ambulatory Visit (INDEPENDENT_AMBULATORY_CARE_PROVIDER_SITE_OTHER): Payer: BLUE CROSS/BLUE SHIELD | Admitting: *Deleted

## 2018-02-22 VITALS — BP 142/95 | HR 58

## 2018-02-22 DIAGNOSIS — A549 Gonococcal infection, unspecified: Secondary | ICD-10-CM | POA: Diagnosis not present

## 2018-02-22 NOTE — Progress Notes (Signed)
Pt is in office today for Rocephin injection for +GC. Injection given, pt tolerated well.  Pt has no other concerns today.  BP (!) 142/95   Pulse (!) 58   LMP 01/26/2018 (LMP Unknown)   Administrations This Visit    cefTRIAXone (ROCEPHIN) injection 250 mg    Admin Date 02/22/2018 Action Given Dose 250 mg Route Intramuscular Administered By Lanney GinsFoster, Traves Majchrzak D, CMA

## 2018-04-26 ENCOUNTER — Ambulatory Visit: Payer: Medicaid Other | Admitting: Obstetrics

## 2018-12-12 ENCOUNTER — Encounter: Payer: Self-pay | Admitting: *Deleted

## 2018-12-12 NOTE — Progress Notes (Signed)
COVID Hotel Screening performed. Temperature, PHQ-9, and need for medical care and medications assessed. No additional needs assessed at this time.  Haskel Khan RN

## 2018-12-19 ENCOUNTER — Other Ambulatory Visit (HOSPITAL_COMMUNITY): Payer: Self-pay

## 2018-12-19 DIAGNOSIS — Z20822 Contact with and (suspected) exposure to covid-19: Secondary | ICD-10-CM

## 2018-12-20 ENCOUNTER — Other Ambulatory Visit: Payer: Self-pay

## 2018-12-20 DIAGNOSIS — Z20822 Contact with and (suspected) exposure to covid-19: Secondary | ICD-10-CM

## 2018-12-20 NOTE — Addendum Note (Signed)
Addended by: Leonia Corona on: 12/20/2018 11:38 AM   Modules accepted: Orders

## 2018-12-20 NOTE — Addendum Note (Signed)
Addended by: Leonia Corona on: 12/20/2018 09:39 AM   Modules accepted: Orders

## 2018-12-26 NOTE — Progress Notes (Signed)
COVID Hotel Screening performed. COVID screening, temperature, PHQ-9, and need for medical care and medications assessed. No additional needs assessed at this time.  Adelyne Marchese RN MSN 

## 2019-01-22 NOTE — Progress Notes (Signed)
COVID Hotel Screening performed. Temperature, PHQ-9, and need for medical care and medications assessed. No additional needs assessed at this time.  Ever Gustafson  MSN, RN 

## 2019-01-23 ENCOUNTER — Other Ambulatory Visit: Payer: Self-pay | Admitting: *Deleted

## 2019-01-23 DIAGNOSIS — Z20822 Contact with and (suspected) exposure to covid-19: Secondary | ICD-10-CM

## 2019-01-25 NOTE — Addendum Note (Signed)
Addended by: Ahri Olson M on: 01/25/2019 09:28 AM   Modules accepted: Orders  

## 2019-02-17 ENCOUNTER — Ambulatory Visit: Payer: Medicaid Other | Admitting: Obstetrics

## 2019-03-05 ENCOUNTER — Other Ambulatory Visit: Payer: Self-pay

## 2019-03-05 ENCOUNTER — Encounter: Payer: Self-pay | Admitting: Obstetrics

## 2019-03-05 ENCOUNTER — Ambulatory Visit (INDEPENDENT_AMBULATORY_CARE_PROVIDER_SITE_OTHER): Payer: Medicaid Other | Admitting: Obstetrics

## 2019-03-05 ENCOUNTER — Other Ambulatory Visit (HOSPITAL_COMMUNITY)
Admission: RE | Admit: 2019-03-05 | Discharge: 2019-03-05 | Disposition: A | Discharge status: Home / Self Care | Payer: Medicaid Other | Source: Ambulatory Visit | Attending: Obstetrics | Admitting: Obstetrics

## 2019-03-05 VITALS — BP 119/77 | HR 68 | Ht 65.0 in | Wt 260.0 lb

## 2019-03-05 DIAGNOSIS — Z113 Encounter for screening for infections with a predominantly sexual mode of transmission: Secondary | ICD-10-CM

## 2019-03-05 DIAGNOSIS — Z01419 Encounter for gynecological examination (general) (routine) without abnormal findings: Secondary | ICD-10-CM | POA: Insufficient documentation

## 2019-03-05 DIAGNOSIS — N898 Other specified noninflammatory disorders of vagina: Secondary | ICD-10-CM

## 2019-03-05 DIAGNOSIS — N944 Primary dysmenorrhea: Secondary | ICD-10-CM

## 2019-03-05 DIAGNOSIS — I10 Essential (primary) hypertension: Secondary | ICD-10-CM

## 2019-03-05 DIAGNOSIS — Z0000 Encounter for general adult medical examination without abnormal findings: Secondary | ICD-10-CM

## 2019-03-05 MED ORDER — PRENATAL PLUS 27-1 MG PO TABS: 1.0000 | ORAL_TABLET | Freq: Every day | ORAL | 11 refills | Status: DC

## 2019-03-05 NOTE — Progress Notes (Signed)
Subjective:        Gloria Chase is a 28 y.o. female here for a routine exam.  Current complaints: None.    Personal health questionnaire:  Is patient Gloria Chase, have a family history of breast and/or ovarian cancer: no Is there a family history of uterine cancer diagnosed at age < 19, gastrointestinal cancer, urinary tract cancer, family member who is a Field seismologist syndrome-associated carrier: no Is the patient overweight and hypertensive, family history of diabetes, personal history of gestational diabetes, preeclampsia or PCOS: no Is patient over 30, have PCOS,  family history of premature CHD under age 19, diabetes, smoke, have hypertension or peripheral artery disease:  no At any time, has a partner hit, kicked or otherwise hurt or frightened you?: no Over the past 2 weeks, have you felt down, depressed or hopeless?: no Over the past 2 weeks, have you felt little interest or pleasure in doing things?:no   Gynecologic History Patient's last menstrual period was 02/10/2019. Contraception: abstinence Last Pap: 02-15-2028. Results were: normal Last mammogram: n/a. Results were: n/a  Obstetric History OB History  Gravida Para Term Preterm AB Living  4 2 2  0 2 2  SAB TAB Ectopic Multiple Live Births  1 0 1 0 2    # Outcome Date GA Lbr Len/2nd Weight Sex Delivery Anes PTL Lv  4 Term 02/18/11 [redacted]w[redacted]d 10:50 / 00:25 9 lb 1.7 oz (4.131 kg) M Vag-Spont EPI  LIV  3 Ectopic 02/2010          2 Term 03/2007    F Vag-Spont EPI  LIV  1 SAB 04/2006            Past Medical History:  Diagnosis Date  . Anemia   . History of colposcopy with cervical biopsy 01/11/2011  . History of trichomoniasis   . History of wisdom tooth extraction 2005  . Hx of hernia repair 2000  . Hypertension     Past Surgical History:  Procedure Laterality Date  . CERVIX LESION DESTRUCTION  12/2015     Current Outpatient Medications:  .  carvedilol (COREG) 12.5 MG tablet, Take 1 tablet (12.5 mg total) by  mouth 2 (two) times daily with a meal., Disp: 60 tablet, Rfl: 11 .  Multiple Vitamin (MULTIVITAMIN) capsule, Take 1 capsule daily by mouth., Disp: , Rfl:  .  triamterene-hydrochlorothiazide (DYAZIDE) 37.5-25 MG capsule, Take 1 each (1 capsule total) by mouth daily., Disp: 30 capsule, Rfl: 11 .  ibuprofen (ADVIL,MOTRIN) 800 MG tablet, Take 1 tablet (800 mg total) by mouth every 8 (eight) hours as needed for cramping., Disp: 60 tablet, Rfl: 5 No Known Allergies  Social History   Tobacco Use  . Smoking status: Never Smoker  . Smokeless tobacco: Never Used  Substance Use Topics  . Alcohol use: Not Currently    Comment: social    Family History  Problem Relation Age of Onset  . Hypertension Mother   . Hypertension Father   . Hypertension Maternal Grandmother   . Hypertension Maternal Grandfather   . Colon cancer Maternal Grandfather       Review of Systems  Constitutional: negative for fatigue and weight loss Respiratory: negative for cough and wheezing Cardiovascular: negative for chest pain, fatigue and palpitations Gastrointestinal: negative for abdominal pain and change in bowel habits Musculoskeletal:negative for myalgias Neurological: negative for gait problems and tremors Behavioral/Psych: negative for abusive relationship, depression Endocrine: negative for temperature intolerance    Genitourinary:negative for abnormal menstrual periods, genital lesions,  hot flashes, sexual problems and vaginal discharge Integument/breast: negative for breast lump, breast tenderness, nipple discharge and skin lesion(s)    Objective:       BP 119/77   Pulse 68   Ht 5\' 5"  (1.651 m)   Wt 260 lb (117.9 kg)   LMP 02/10/2019   BMI 43.27 kg/m  General:   alert  Skin:   no rash or abnormalities  Lungs:   clear to auscultation bilaterally  Heart:   regular rate and rhythm, S1, S2 normal, no murmur, click, rub or gallop  Breasts:   normal without suspicious masses, skin or nipple changes  or axillary nodes  Abdomen:  normal findings: no organomegaly, soft, non-tender and no hernia  Pelvis:  External genitalia: normal general appearance Urinary system: urethral meatus normal and bladder without fullness, nontender Vaginal: normal without tenderness, induration or masses Cervix: normal appearance Adnexa: normal bimanual exam Uterus: anteverted and non-tender, normal size   Lab Review Urine pregnancy test Labs reviewed yes Radiologic studies reviewed no  50% of 10 min visit spent on counseling and coordination of care.   Assessment:    Healthy female exam.    Plan:    Education reviewed: calcium supplements, depression evaluation, low fat, low cholesterol diet, safe sex/STD prevention, self breast exams and weight bearing exercise. Contraception: abstinence. Follow up in: 1 year.   No orders of the defined types were placed in this encounter.  Orders Placed This Encounter  Procedures  . HIV Antibody (routine testing w rflx)  . Hepatitis B surface antigen  . RPR  . Hepatitis C antibody    Brock BadHarper, Shirlean Berman A, MD 03/05/2019 9:40 AM

## 2019-03-06 LAB — HEPATITIS B SURFACE ANTIGEN: Hepatitis B Surface Ag: NEGATIVE

## 2019-03-06 LAB — CYTOLOGY - PAP
Diagnosis: NEGATIVE
HPV: NOT DETECTED

## 2019-03-06 LAB — RPR: RPR Ser Ql: NONREACTIVE

## 2019-03-06 LAB — HEPATITIS C ANTIBODY: Hep C Virus Ab: <0.1 s/co ratio (ref 0.0–0.9)

## 2019-03-06 LAB — HIV ANTIBODY (ROUTINE TESTING W REFLEX): HIV Screen 4th Generation wRfx: NONREACTIVE

## 2019-03-07 LAB — CERVICOVAGINAL ANCILLARY ONLY
Bacterial vaginitis: POSITIVE — AB
Candida vaginitis: NEGATIVE
Chlamydia: NEGATIVE
Neisseria Gonorrhea: NEGATIVE
Trichomonas: NEGATIVE

## 2019-03-08 ENCOUNTER — Other Ambulatory Visit: Payer: Self-pay | Admitting: Obstetrics

## 2019-03-08 DIAGNOSIS — B9689 Other specified bacterial agents as the cause of diseases classified elsewhere: Secondary | ICD-10-CM

## 2019-03-08 DIAGNOSIS — N760 Acute vaginitis: Secondary | ICD-10-CM

## 2019-03-08 MED ORDER — METRONIDAZOLE 500 MG PO TABS: 500.0000 mg | ORAL_TABLET | Freq: Two times a day (BID) | ORAL | 2 refills | Status: DC

## 2019-03-09 NOTE — Progress Notes (Signed)
COVID-19 Screening performed. Temperature, PHQ-9, and need for medical care and medications assessed. No additional needs assessed at this time.  Ludmilla Mcgillis MSN, RN 

## 2019-03-10 ENCOUNTER — Telehealth: Payer: Self-pay

## 2019-03-10 NOTE — Telephone Encounter (Signed)
-----   Message from Shelly Bombard, MD sent at 03/08/2019  8:59 AM EDT ----- Flagyl Rx for BV

## 2019-03-10 NOTE — Telephone Encounter (Signed)
Left VM message to call Office.  MyChart message sent to pick up RX at [facility].

## 2019-03-11 ENCOUNTER — Other Ambulatory Visit: Payer: Self-pay | Admitting: Obstetrics

## 2019-03-11 DIAGNOSIS — I10 Essential (primary) hypertension: Secondary | ICD-10-CM

## 2019-03-12 NOTE — Telephone Encounter (Signed)
Refill request    Name from pharmacy: TRIAMTERENE-HCTZ 37.5-25 [place] [facility]       Will file in chart as: triamterene-hydrochlorothiazide (DYAZIDE) 37.5-25 MG capsule   Sig: Take 1 each (1 capsule total) by mouth daily.   Disp:  30 capsule  Refills:  9   Start: 03/11/2019   Class: Normal   Non-formulary For: HTN (hypertension), benign   Last ordered: 1 year ago by Shelly Bombard, MD Last refill: 12/24/2018   Rx #: [id number]      Name from pharmacy: CARVEDILOL 12.5 MG TABLET       Will file in chart as: carvedilol (COREG) 12.5 MG tablet   Sig: Take 1 tablet (12.5 mg total) by mouth 2 (two) times daily with a meal.   Disp:  60 tablet  Refills:  9   Start: 03/11/2019   Class: Normal   Non-formulary For: HTN (hypertension), benign   Last ordered: 1 year ago by Shelly Bombard, MD Last refill: 12/24/2018   Rx #: [id number]     To be filled at: [id number] [place], [place] - [address]

## 2019-03-17 ENCOUNTER — Encounter

## 2019-03-18 ENCOUNTER — Telehealth: Payer: Self-pay

## 2019-03-18 NOTE — Telephone Encounter (Signed)
Returned call to patient, she took her Rx and is feeling better.  Explained BV to her and tried to encourage her about some stressors in her life.

## 2019-08-08 ENCOUNTER — Other Ambulatory Visit: Payer: Self-pay | Admitting: Obstetrics

## 2019-08-08 ENCOUNTER — Telehealth: Payer: Self-pay | Admitting: *Deleted

## 2019-08-08 DIAGNOSIS — A6000 Herpesviral infection of urogenital system, unspecified: Secondary | ICD-10-CM

## 2019-08-08 MED ORDER — VALACYCLOVIR HCL 1 G PO TABS: 1000.0000 mg | ORAL_TABLET | Freq: Two times a day (BID) | ORAL | 5 refills | Status: DC

## 2019-08-08 NOTE — Telephone Encounter (Signed)
Pt called to office stating she needs Rx for Valtrex.  Spoke with pt, states she was dx with HSV few years ago but has never had outbreak until now she thinks. Pt states a couple vaginal lesions, one ruptured and another small area. Pt is having some discomfort in area and would like tx.   Pt advised message to be sent to provider for Rx.   Please send appropriate dose/directions to [facility] on [address].

## 2019-08-08 NOTE — Telephone Encounter (Signed)
Valtrex Rx 

## 2019-10-17 ENCOUNTER — Other Ambulatory Visit: Payer: Self-pay

## 2019-10-17 ENCOUNTER — Ambulatory Visit: Payer: Medicaid Other | Attending: Internal Medicine

## 2019-10-17 DIAGNOSIS — Z20822 Contact with and (suspected) exposure to covid-19: Secondary | ICD-10-CM

## 2019-10-18 ENCOUNTER — Encounter (HOSPITAL_COMMUNITY): Payer: Self-pay | Admitting: Emergency Medicine

## 2019-10-18 ENCOUNTER — Other Ambulatory Visit: Payer: Self-pay

## 2019-10-18 ENCOUNTER — Emergency Department (HOSPITAL_COMMUNITY)
Admission: EM | Admit: 2019-10-18 | Discharge: 2019-10-18 | Disposition: A | Discharge status: Home / Self Care | Payer: Medicaid Other | Attending: Emergency Medicine | Admitting: Emergency Medicine

## 2019-10-18 ENCOUNTER — Emergency Department (HOSPITAL_COMMUNITY): Payer: Medicaid Other

## 2019-10-18 DIAGNOSIS — R079 Chest pain, unspecified: Secondary | ICD-10-CM | POA: Diagnosis not present

## 2019-10-18 DIAGNOSIS — I10 Essential (primary) hypertension: Secondary | ICD-10-CM | POA: Diagnosis not present

## 2019-10-18 DIAGNOSIS — U071 COVID-19: Secondary | ICD-10-CM | POA: Diagnosis not present

## 2019-10-18 DIAGNOSIS — R05 Cough: Secondary | ICD-10-CM | POA: Diagnosis not present

## 2019-10-18 DIAGNOSIS — R0789 Other chest pain: Secondary | ICD-10-CM | POA: Diagnosis not present

## 2019-10-18 LAB — CBC WITH DIFFERENTIAL/PLATELET
Abs Immature Granulocytes: 0.01 10*3/uL (ref 0.00–0.07)
Basophils Absolute: 0 10*3/uL (ref 0.0–0.1)
Basophils Relative: 1 %
Eosinophils Absolute: 0 10*3/uL (ref 0.0–0.5)
Eosinophils Relative: 1 %
HCT: 45.1 % (ref 36.0–46.0)
Hemoglobin: 13.7 g/dL (ref 12.0–15.0)
Immature Granulocytes: 0 %
Lymphocytes Relative: 47 %
Lymphs Abs: 1.8 10*3/uL (ref 0.7–4.0)
MCH: 27.4 pg (ref 26.0–34.0)
MCHC: 30.4 g/dL (ref 30.0–36.0)
MCV: 90.2 fL (ref 80.0–100.0)
Monocytes Absolute: 0.3 10*3/uL (ref 0.1–1.0)
Monocytes Relative: 8 %
Neutro Abs: 1.6 10*3/uL — ABNORMAL LOW (ref 1.7–7.7)
Neutrophils Relative %: 43 %
Platelets: 255 10*3/uL (ref 150–400)
RBC: 5 MIL/uL (ref 3.87–5.11)
RDW: 13.9 % (ref 11.5–15.5)
WBC: 3.8 10*3/uL — ABNORMAL LOW (ref 4.0–10.5)
nRBC: 0 % (ref 0.0–0.2)

## 2019-10-18 LAB — BASIC METABOLIC PANEL
Anion gap: 7 (ref 5–15)
BUN: 12 mg/dL (ref 6–20)
CO2: 29 mmol/L (ref 22–32)
Calcium: 8.4 mg/dL — ABNORMAL LOW (ref 8.9–10.3)
Chloride: 102 mmol/L (ref 98–111)
Creatinine, Ser: 0.97 mg/dL (ref 0.44–1.00)
GFR calc Af Amer: >60 mL/min (ref >60)
GFR calc non Af Amer: >60 mL/min (ref >60)
Glucose, Bld: 124 mg/dL — ABNORMAL HIGH (ref 70–99)
Potassium: 3.6 mmol/L (ref 3.5–5.1)
Sodium: 138 mmol/L (ref 135–145)

## 2019-10-18 LAB — NOVEL CORONAVIRUS, NAA: SARS-CoV-2, NAA: DETECTED — AB

## 2019-10-18 LAB — I-STAT BETA HCG BLOOD, ED (MC, WL, AP ONLY): I-stat hCG, quantitative: <5 m[IU]/mL (ref <5)

## 2019-10-18 LAB — SARS-COV-2, NAA 2 DAY TAT

## 2019-10-18 LAB — TROPONIN I (HIGH SENSITIVITY): Troponin I (High Sensitivity): <2 ng/L (ref <18)

## 2019-10-18 MED ORDER — KETOROLAC TROMETHAMINE 30 MG/ML IJ SOLN
30.0000 mg | Freq: Once | INTRAMUSCULAR | Status: AC
  Administered 2019-10-18: 30 mg via INTRAMUSCULAR
  Filled 2019-10-18: qty 1

## 2019-10-18 MED ORDER — ACETAMINOPHEN 500 MG PO TABS
1000.0000 mg | ORAL_TABLET | Freq: Once | ORAL | Status: AC
  Administered 2019-10-18: 1000 mg via ORAL
  Filled 2019-10-18: qty 2

## 2019-10-18 MED ORDER — ALBUTEROL SULFATE HFA 108 (90 BASE) MCG/ACT IN AERS
2.0000 | INHALATION_SPRAY | Freq: Once | RESPIRATORY_TRACT | Status: AC
  Administered 2019-10-18: 2 via RESPIRATORY_TRACT
  Filled 2019-10-18: qty 6.7

## 2019-10-18 MED ORDER — DM-GUAIFENESIN ER 30-600 MG PO TB12
1.0000 | ORAL_TABLET | Freq: Two times a day (BID) | ORAL | Status: DC
  Administered 2019-10-18: 1 via ORAL
  Filled 2019-10-18: qty 1

## 2019-10-18 NOTE — Discharge Instructions (Signed)
You have tested positive for COVID-19 virus.  Please continue to quarantine at home and monitor your symptoms closely. You chest x-ray was clear and lab work looks good.  Antibiotics are not helpful in treating viral infection, the virus should run its course in about 10-14 days. Please make sure you are drinking plenty of fluids. You can treat your symptoms supportively with tylenol 1000 mg every 6 hours, and ibuprofen 600 mg every 6 hours for fevers and pains, and Mucinex and throat lozenges to help with cough.  You can also use the albuterol inhaler given to you today if your symptoms are not improving please follow up with you Primary doctor.   I recommend that you purchase a home pulse ox to help better monitor your oxygen at home, if you start to have increased work of breathing or shortness of breath or your oxygen drops below 90% please immediately return to the hospital for reevaluation.  If you develop persistent fevers, shortness of breath or difficulty breathing, chest pain, severe headache and neck pain, persistent nausea and vomiting or other new or concerning symptoms return to the Emergency department.

## 2019-10-18 NOTE — ED Triage Notes (Signed)
Patient has had chest pain and SOB since Monday. Chest pain has worsened since yesterday. Patient found out she was Covid + this am.

## 2019-10-18 NOTE — ED Notes (Signed)
Patient ambulated around her room about 60 ft. O2 saturations did not drop below the upper 90's and she tolerated it well.

## 2019-10-18 NOTE — ED Provider Notes (Signed)
[facility]-EMERGENCY DEPT Provider Note   CSN: [id number] Arrival date & time: 10/18/19  1542     History Chief Complaint  Patient presents with  . Chest Pain  . Shortness of Breath  . Covid Positive    Gloria Chase is a 29 y.o. female.  HPI  Gloria Chase is a 29 y.o. female with history of anemia and hypertension, who presents to the ED for evaluation of chest pain and shortness of breath since Monday.  She found out today that she tested positive for Covid.  She denies any known sick contacts.  Reports on Monday she started feeling general malaise, fatigue, chills, cough and headache.  She then started to notice some central chest pain that is worse with breathing and cough.  She reports cough has been nonproductive.  She states chest pain is worse with breathing, certain movements and cough.  Reports mild shortness of breath she describes as a tightness when taking a deep breath.  No associated lightheadedness or syncope.  No lower extremity swelling or pain.  She is not on any birth control and has no history of PE or DVT.  She has been taking Motrin intermittently for body aches, took some Benadryl thinking that she may have allergies, but after symptoms persisted finally went to get Covid tested.  She states her 2 children are experiencing similar symptoms.  No other aggravating or alleviating factors.   Past Medical History:  Diagnosis Date  . Anemia   . History of colposcopy with cervical biopsy 01/11/2011  . History of trichomoniasis   . History of wisdom tooth extraction 2005  . Hx of hernia repair 2000  . Hypertension     Patient Active Problem List   Diagnosis Date Noted  . Papanicolaou smear of cervix with low grade squamous intraepithelial lesion (LGSIL) 11/23/2016  . Status post cryoablation 11/23/2016  . Benign essential hypertension 11/23/2016    Past Surgical History:  Procedure Laterality Date  . CERVIX LESION DESTRUCTION   12/2015     OB History    Gravida  4   Para  2   Term  2   Preterm  0   AB  2   Living  2     SAB  1   TAB  0   Ectopic  1   Multiple  0   Live Births  2           Family History  Problem Relation Age of Onset  . Hypertension Mother   . Hypertension Father   . Hypertension Maternal Grandmother   . Hypertension Maternal Grandfather   . Colon cancer Maternal Grandfather     Social History   Tobacco Use  . Smoking status: Never Smoker  . Smokeless tobacco: Never Used  Substance Use Topics  . Alcohol use: Not Currently    Comment: social  . Drug use: Not Currently    Types: Marijuana    Home Medications Prior to Admission medications   Medication Sig Start Date End Date Taking? Authorizing Provider  carvedilol (COREG) 12.5 MG tablet TAKE 1 TABLET (12.5 MG TOTAL) BY MOUTH 2 (TWO) TIMES DAILY WITH A MEAL. Patient taking differently: Take 12.5 mg by mouth daily.  03/12/19  Yes Brock Bad, MD  ibuprofen (ADVIL,MOTRIN) 800 MG tablet Take 1 tablet (800 mg total) by mouth every 8 (eight) hours as needed for cramping. 02/14/18  Yes Brock Bad, MD  Multiple Vitamin (MULTIVITAMIN) capsule Take  1 capsule daily by mouth.   Yes [provider]  metroNIDAZOLE (FLAGYL) 500 MG tablet Take 1 tablet (500 mg total) by mouth 2 (two) times daily. Patient not taking: Reported on 10/18/2019 03/08/19   Shelly Bombard, MD  prenatal vitamin w/FE, FA (PRENATAL 1 + 1) 27-1 MG TABS tablet Take 1 tablet by mouth daily before breakfast. Patient not taking: Reported on 10/18/2019 03/05/19   Shelly Bombard, MD  triamterene-hydrochlorothiazide (DYAZIDE) 37.5-25 MG capsule TAKE 1 EACH (1 CAPSULE TOTAL) BY MOUTH DAILY. Patient not taking: Reported on 10/18/2019 03/12/19   Shelly Bombard, MD  valACYclovir (VALTREX) 1000 MG tablet Take 1 tablet (1,000 mg total) by mouth 2 (two) times daily. Take for 3 days, with each outbreak. Patient not taking: Reported on 10/18/2019  08/08/19   Shelly Bombard, MD    Allergies    Patient has no known allergies.  Review of Systems   Review of Systems  Constitutional: Positive for chills and fatigue. Negative for fever.  HENT: Positive for rhinorrhea. Negative for congestion and sore throat.   Respiratory: Positive for cough and shortness of breath.   Cardiovascular: Positive for chest pain.  Gastrointestinal: Negative for abdominal pain, diarrhea, nausea and vomiting.  Musculoskeletal: Positive for myalgias.  Skin: Negative for rash.  Neurological: Positive for headaches.     Physical Exam Updated Vital Signs BP (!) 118/93   Pulse (!) 53   Temp 99 F (37.2 C) (Oral)   Resp 20   Ht 5\' 5"  (1.651 m)   Wt 111.1 kg   LMP 10/11/2019   SpO2 97%   BMI 40.77 kg/m   Physical Exam Vitals and nursing note reviewed.  Constitutional:      General: She is not in acute distress.    Appearance: She is well-developed. She is obese. She is not ill-appearing or diaphoretic.  HENT:     Head: Normocephalic and atraumatic.     Nose: Rhinorrhea present. No congestion.     Mouth/Throat:     Mouth: Mucous membranes are moist.     Pharynx: Oropharynx is clear. No oropharyngeal exudate or posterior oropharyngeal erythema.     Comments: Posterior oropharynx clear, mucous membranes moist, no erythema, exudates or edema Eyes:     General:        Right eye: No discharge.        Left eye: No discharge.  Neck:     Comments: No rigidity Cardiovascular:     Rate and Rhythm: Normal rate and regular rhythm.     Pulses: Normal pulses.     Heart sounds: Normal heart sounds. No murmur. No friction rub. No gallop.   Pulmonary:     Effort: Pulmonary effort is normal. No respiratory distress.     Breath sounds: Normal breath sounds.     Comments: Respirations equal and unlabored, patient able to speak in full sentences, lungs clear to auscultation bilaterally, there is some anterior chest wall tenderness Abdominal:     General:  Bowel sounds are normal. There is no distension.     Palpations: Abdomen is soft. There is no mass.     Tenderness: There is no abdominal tenderness. There is no guarding.     Comments: Abdomen soft, nondistended, nontender to palpation in all quadrants without guarding or peritoneal signs  Musculoskeletal:        General: No deformity.     Cervical back: Neck supple.     Right lower leg: No edema.  Left lower leg: No edema.  Lymphadenopathy:     Cervical: No cervical adenopathy.  Skin:    General: Skin is warm and dry.     Capillary Refill: Capillary refill takes less than 2 seconds.  Neurological:     Mental Status: She is alert and oriented to person, place, and time.  Psychiatric:        Mood and Affect: Mood normal.        Behavior: Behavior normal.     ED Results / Procedures / Treatments   Labs (all labs ordered are listed, but only abnormal results are displayed) Labs Reviewed  BASIC METABOLIC PANEL - Abnormal; Notable for the following components:      Result Value   Glucose, Bld 124 (*)    Calcium 8.4 (*)    All other components within normal limits  CBC WITH DIFFERENTIAL/PLATELET - Abnormal; Notable for the following components:   WBC 3.8 (*)    Neutro Abs 1.6 (*)    All other components within normal limits  I-STAT BETA HCG BLOOD, ED ([facility], [facility], [facility] ONLY)  TROPONIN I (HIGH SENSITIVITY)    EKG None  Radiology DG Chest Port 1 View  Result Date: 10/18/2019 CLINICAL DATA:  29 year old female with chest pain, shortness of breath, productive cough and headache EXAM: PORTABLE CHEST 1 VIEW COMPARISON:  Prior chest x-ray 07/19/2013 FINDINGS: The lungs are clear and negative for focal airspace consolidation, pulmonary edema or suspicious pulmonary nodule. No pleural effusion or pneumothorax. Cardiac and mediastinal contours are within normal limits. No acute fracture or lytic or blastic osseous lesions. The visualized upper abdominal bowel gas pattern is unremarkable.  IMPRESSION: No active disease. Electronically Signed   By: Malachy Moan M.D.   On: 10/18/2019 17:39    Procedures Procedures (including critical care time)  Medications Ordered in ED Medications  dextromethorphan-guaiFENesin (MUCINEX DM) 30-600 MG per 12 hr tablet 1 tablet (1 tablet Oral Given 10/18/19 1644)  acetaminophen (TYLENOL) tablet 1,000 mg (1,000 mg Oral Given 10/18/19 1641)  albuterol (VENTOLIN HFA) 108 (90 Base) MCG/ACT inhaler 2 puff (2 puffs Inhalation Given 10/18/19 1641)  ketorolac (TORADOL) 30 MG/ML injection 30 mg (30 mg Intramuscular Given 10/18/19 1759)    ED Course  I have reviewed the triage vital signs and the nursing notes.  Pertinent labs & imaging results that were available during my care of the patient were reviewed by me and considered in my medical decision making (see chart for details).    MDM Rules/Calculators/A&P                       29 year old female presents after she was diagnosed with Covid infection today, her symptoms initially began on Monday with cough, congestion, fevers, chills, body aches, developing chest pain and shortness of breath on Tuesday.  On arrival she is satting at 99% on room air has no increased work of breathing with clear lungs.  She does report some central chest pain that is worse with deep breath and coughing as well as with some movements.  Pain is reproducible with palpation.  Will check basic labs, troponin, EKG and chest x-ray.  I suspect patient's chest discomfort is related to her Covid infection.  She has no risk factors for PE and is not tachycardic or hypoxic.  Will give albuterol, Tylenol, Toradol, and Mucinex for symptomatic treatment.  Gloria Chase was evaluated in Emergency Department on 10/18/2019 for the symptoms described in the history of present  illness. She was evaluated in the context of the global COVID-19 pandemic, which necessitated consideration that the patient might be at risk for infection with the  SARS-CoV-2 virus that causes COVID-19. Institutional protocols and algorithms that pertain to the evaluation of patients at risk for COVID-19 are in a state of rapid change based on information released by regulatory bodies including the CDC and federal and state organizations. These policies and algorithms were followed during the patient's care in the ED.  Final Clinical Impression(s) / ED Diagnoses Final diagnoses:  COVID-19 virus infection  Atypical chest pain    Rx / DC Orders ED Discharge Orders    None       Legrand Rams 10/18/19 1929    Wynetta Fines, MD 10/18/19 2111

## 2019-10-19 ENCOUNTER — Encounter: Payer: Self-pay | Admitting: Physician Assistant

## 2019-10-19 ENCOUNTER — Other Ambulatory Visit: Payer: Self-pay | Admitting: Physician Assistant

## 2019-10-19 DIAGNOSIS — U071 COVID-19: Secondary | ICD-10-CM

## 2019-10-19 DIAGNOSIS — I10 Essential (primary) hypertension: Secondary | ICD-10-CM

## 2019-10-19 MED ORDER — SODIUM CHLORIDE 0.9 % IV SOLN
700.0000 mg | Freq: Once | INTRAVENOUS | Status: DC
  Filled 2019-10-19: qty 20

## 2019-10-19 NOTE — Progress Notes (Signed)
  I connected by phone with Gloria Chase on 10/19/2019 at 8:27 AM to discuss the potential use of an new treatment for mild to moderate COVID-19 viral infection in non-hospitalized patients.  This patient is a 29 y.o. female that meets the FDA criteria for Emergency Use Authorization of bamlanivimab or casirivimab\imdevimab.  Has a (+) direct SARS-CoV-2 viral test result  Has mild or moderate COVID-19   Is ? 31 years of age and weighs ? 40 kg  Is NOT hospitalized due to COVID-19  Is NOT requiring oxygen therapy or requiring an increase in baseline oxygen flow rate due to COVID-19  Is within 10 days of symptom onset  Has at least one of the high risk factor(s) for progression to severe COVID-19 and/or hospitalization as defined in EUA.  Specific high risk criteria : BMI >/= 35 & HTN   I have spoken and communicated the following to the patient or parent/caregiver:  1. FDA has authorized the emergency use of bamlanivimab and casirivimab\imdevimab for the treatment of mild to moderate COVID-19 in adults and pediatric patients with positive results of direct SARS-CoV-2 viral testing who are 42 years of age and older weighing at least 40 kg, and who are at high risk for progressing to severe COVID-19 and/or hospitalization.  2. The significant known and potential risks and benefits of bamlanivimab and casirivimab\imdevimab, and the extent to which such potential risks and benefits are unknown.  3. Information on available alternative treatments and the risks and benefits of those alternatives, including clinical trials.  4. Patients treated with bamlanivimab and casirivimab\imdevimab should continue to self-isolate and use infection control measures (e.g., wear mask, isolate, social distance, avoid sharing personal items, clean and disinfect "high touch" surfaces, and frequent handwashing) according to CDC guidelines.   5. The patient or parent/caregiver has the option to accept or  refuse bamlanivimab or casirivimab\imdevimab .  After reviewing this information with the patient, The patient agreed to proceed with receiving the bamlanimivab infusion and will be provided a copy of the Fact sheet prior to receiving the infusion.   Sx onset 3/29. Infusion set up for tomorrow 4/5 @ 11:30am. Directions given   Gloria Chase 10/19/2019 8:27 AM

## 2019-10-20 ENCOUNTER — Ambulatory Visit (HOSPITAL_COMMUNITY)
Admission: RE | Admit: 2019-10-20 | Discharge: 2019-10-20 | Disposition: A | Discharge status: Home / Self Care | Payer: Medicaid Other | Source: Ambulatory Visit | Attending: Pulmonary Disease | Admitting: Pulmonary Disease

## 2019-10-20 DIAGNOSIS — U071 COVID-19: Secondary | ICD-10-CM

## 2019-10-20 DIAGNOSIS — I10 Essential (primary) hypertension: Secondary | ICD-10-CM | POA: Diagnosis not present

## 2019-10-20 MED ORDER — EPINEPHRINE 0.3 MG/0.3ML IJ SOAJ: 0.3000 mg | Freq: Once | INTRAMUSCULAR | Status: DC | PRN

## 2019-10-20 MED ORDER — METHYLPREDNISOLONE SODIUM SUCC 125 MG IJ SOLR: 125.0000 mg | Freq: Once | INTRAMUSCULAR | Status: DC | PRN

## 2019-10-20 MED ORDER — DIPHENHYDRAMINE HCL 50 MG/ML IJ SOLN: 50.0000 mg | Freq: Once | INTRAMUSCULAR | Status: DC | PRN

## 2019-10-20 MED ORDER — ALBUTEROL SULFATE HFA 108 (90 BASE) MCG/ACT IN AERS: 2.0000 | INHALATION_SPRAY | Freq: Once | RESPIRATORY_TRACT | Status: DC | PRN

## 2019-10-20 MED ORDER — FAMOTIDINE IN NACL 20-0.9 MG/50ML-% IV SOLN: 20.0000 mg | Freq: Once | INTRAVENOUS | Status: DC | PRN

## 2019-10-20 MED ORDER — SODIUM CHLORIDE 0.9 % IV SOLN
700.0000 mg | Freq: Once | INTRAVENOUS | Status: AC
  Administered 2019-10-20: 700 mg via INTRAVENOUS
  Filled 2019-10-20: qty 20

## 2019-10-20 MED ORDER — SODIUM CHLORIDE 0.9 % IV SOLN
INTRAVENOUS | Status: DC | PRN
  Administered 2019-10-20: 250 mL via INTRAVENOUS

## 2019-10-20 NOTE — Discharge Instructions (Signed)

## 2019-10-20 NOTE — Progress Notes (Signed)
  Diagnosis: COVID-19  Physician: Dr. Delford Field  Procedure: Covid Infusion Clinic Med: bamlanivimab infusion - Provided patient with bamlanimivab fact sheet for patients, parents and caregivers prior to infusion.  Complications: No immediate complications noted.  Discharge: Discharged home   Gloria Chase, [name] 10/20/2019

## 2019-10-21 ENCOUNTER — Other Ambulatory Visit: Payer: Self-pay | Admitting: Obstetrics

## 2019-10-21 DIAGNOSIS — N944 Primary dysmenorrhea: Secondary | ICD-10-CM

## 2019-11-04 ENCOUNTER — Other Ambulatory Visit: Payer: Self-pay

## 2019-11-04 ENCOUNTER — Other Ambulatory Visit (HOSPITAL_COMMUNITY)
Admission: RE | Admit: 2019-11-04 | Discharge: 2019-11-04 | Disposition: A | Discharge status: Home / Self Care | Payer: Medicaid Other | Source: Ambulatory Visit | Attending: Obstetrics | Admitting: Obstetrics

## 2019-11-04 ENCOUNTER — Ambulatory Visit (INDEPENDENT_AMBULATORY_CARE_PROVIDER_SITE_OTHER): Payer: Medicaid Other

## 2019-11-04 VITALS — Wt 270.8 lb

## 2019-11-04 DIAGNOSIS — Z202 Contact with and (suspected) exposure to infections with a predominantly sexual mode of transmission: Secondary | ICD-10-CM | POA: Insufficient documentation

## 2019-11-04 NOTE — Progress Notes (Signed)
Pt is here for STD testing. She recently started being sexually active again and she wants to be checked, she denies any systems. Pt advised she will be notified of any abnormal results,pt voices understanding.

## 2019-11-05 ENCOUNTER — Other Ambulatory Visit: Payer: Self-pay | Admitting: Obstetrics

## 2019-11-05 DIAGNOSIS — N760 Acute vaginitis: Secondary | ICD-10-CM

## 2019-11-05 DIAGNOSIS — B9689 Other specified bacterial agents as the cause of diseases classified elsewhere: Secondary | ICD-10-CM

## 2019-11-05 LAB — HEPATITIS B SURFACE ANTIGEN: Hepatitis B Surface Ag: NEGATIVE

## 2019-11-05 LAB — CERVICOVAGINAL ANCILLARY ONLY
Bacterial Vaginitis (gardnerella): POSITIVE — AB
Candida Glabrata: NEGATIVE
Candida Vaginitis: NEGATIVE
Chlamydia: NEGATIVE
Comment: NEGATIVE
Comment: NEGATIVE
Comment: NEGATIVE
Comment: NEGATIVE
Comment: NEGATIVE
Comment: NORMAL
Neisseria Gonorrhea: NEGATIVE
Trichomonas: NEGATIVE

## 2019-11-05 LAB — HIV ANTIBODY (ROUTINE TESTING W REFLEX): HIV Screen 4th Generation wRfx: NONREACTIVE

## 2019-11-05 LAB — RPR: RPR Ser Ql: NONREACTIVE

## 2019-11-05 LAB — HEPATITIS C ANTIBODY: Hep C Virus Ab: <0.1 s/co ratio (ref 0.0–0.9)

## 2019-11-05 MED ORDER — METRONIDAZOLE 500 MG PO TABS: 500.0000 mg | ORAL_TABLET | Freq: Two times a day (BID) | ORAL | 2 refills | Status: DC

## 2019-12-02 NOTE — Progress Notes (Signed)
Patient was assessed and managed by nursing staff during this encounter. I have reviewed the chart and agree with the documentation and plan. I have also made any necessary editorial changes.  Rebie Peale, MD 12/02/2019 3:23 PM    

## 2020-03-29 ENCOUNTER — Ambulatory Visit: Payer: Medicaid Other | Admitting: Internal Medicine

## 2020-04-06 ENCOUNTER — Other Ambulatory Visit: Payer: Self-pay | Admitting: Obstetrics

## 2020-04-06 DIAGNOSIS — I10 Essential (primary) hypertension: Secondary | ICD-10-CM

## 2020-04-08 ENCOUNTER — Telehealth: Payer: Self-pay

## 2020-04-08 NOTE — Telephone Encounter (Signed)
Pt called asking for refill on BP medication she has been prescribed previously from [place]. I advised pt that I am not able to prescribe this type of medication for her as that would need to come from her PCP. Pt reports she has an appointment tomorrow with a primary care provider. I advised pt to let them know the situation and the medication she has been on so that they can manage her for the hypertension. Pt was upset with this answer, voiced her frustration using expletives, and hung up the phone.

## 2020-04-09 ENCOUNTER — Other Ambulatory Visit: Payer: Self-pay | Admitting: Obstetrics

## 2020-04-09 ENCOUNTER — Ambulatory Visit: Payer: Medicaid Other | Attending: Family Medicine | Admitting: Family Medicine

## 2020-04-09 ENCOUNTER — Encounter: Payer: Self-pay | Admitting: Family Medicine

## 2020-04-09 DIAGNOSIS — I10 Essential (primary) hypertension: Secondary | ICD-10-CM

## 2020-04-09 DIAGNOSIS — Z789 Other specified health status: Secondary | ICD-10-CM

## 2020-04-09 DIAGNOSIS — Z0000 Encounter for general adult medical examination without abnormal findings: Secondary | ICD-10-CM

## 2020-04-09 DIAGNOSIS — F411 Generalized anxiety disorder: Secondary | ICD-10-CM

## 2020-04-09 MED ORDER — CARVEDILOL 12.5 MG PO TABS: 12.5000 mg | ORAL_TABLET | Freq: Two times a day (BID) | ORAL | 1 refills | Status: DC

## 2020-04-09 MED ORDER — VENLAFAXINE HCL ER 75 MG PO CP24: 75.0000 mg | ORAL_CAPSULE | Freq: Every day | ORAL | 1 refills | Status: DC

## 2020-04-09 MED ORDER — TRIAMTERENE-HCTZ 37.5-25 MG PO CAPS: 1.0000 | ORAL_CAPSULE | Freq: Every day | ORAL | 1 refills | Status: DC

## 2020-04-09 NOTE — Progress Notes (Signed)
Would like a physical for next appointment  Needs refill on BP medication. Has been out for 1 week.

## 2020-04-09 NOTE — Progress Notes (Signed)
Virtual Visit via Telephone Note  I connected with Gloria Chase  on 04/09/20 at  1:50 PM EDT by telephone and verified that I am speaking with the correct person using two identifiers.   I discussed the limitations, risks, security and privacy concerns of performing an evaluation and management service by telephone and the availability of in person appointments. I also discussed with the patient that there may be a patient responsible charge related to this service. The patient expressed understanding and agreed to proceed.  Patient Location: Home Provider Location: [facility] Others participating in call: none   History of Present Illness:       29 yo female who is new to the practice and is currently out of her blood pressure medication- carvedilol for more than a week. She still has some of the triamterene HCTZ but has not taken this medication recently.  She has had some headaches which she believes may be related to her blood pressure but she also has had issues with increased anxiety.  She has been on medication for anxiety in the past but cannot recall specifically which medication.  She reports that she has had increased stress due to being a parent as well as grief over the loss of her grandmother recently.  She has also noticed some recent increased leg swelling but she stands at her job as well.  She has noticed that the end of the day, that her socks are leaving indentations in her legs.  Sometimes when she is feeling anxious she will also feel as if she is short of breath.  Past Medical History:  Diagnosis Date  . Anemia   . History of colposcopy with cervical biopsy 01/11/2011  . History of trichomoniasis   . History of wisdom tooth extraction 2005  . Hx of hernia repair 2000  . Hypertension   . Morbid obesity (HCC)     Past Surgical History:  Procedure Laterality Date  . CERVIX LESION DESTRUCTION  12/2015    Family History  Problem Relation Age of Onset  .  Hypertension Mother   . Hypertension Father   . Hypertension Maternal Grandmother   . Hypertension Maternal Grandfather   . Colon cancer Maternal Grandfather     Social History   Tobacco Use  . Smoking status: Never Smoker  . Smokeless tobacco: Never Used  Vaping Use  . Vaping Use: Never used  Substance Use Topics  . Alcohol use: Not Currently    Comment: social  . Drug use: Not Currently    Types: Marijuana     No Known Allergies     Observations/Objective: No vital signs or physical exam conducted as visit was done via telephone  Assessment and Plan: 1. HTN (hypertension), benign Refills provided for carvedilol and triamterene HCTZ.  She is encouraged to take both medications.  She should notify the office if her swelling does not improve.  She is also to schedule an in person appointment for blood pressure recheck. - carvedilol (COREG) 12.5 MG tablet; Take 1 tablet (12.5 mg total) by mouth 2 (two) times daily with a meal.  Dispense: 180 tablet; Refill: 1 - triamterene-hydrochlorothiazide (DYAZIDE) 37.5-25 MG capsule; Take 1 each (1 capsule total) by mouth daily.  Dispense: 90 capsule; Refill: 1  2. GAD (generalized anxiety disorder); 3.  Need for follow-up by social work Patient with complaint of longstanding issues with anxiety which have become worse due to increased stress as well as recent death of her grandmother.  She agrees to speak with the social worker for counseling and to see if there are other resources available.  She also agrees to start medication to help with her anxiety.  Prescription has been sent to her pharmacy for venlafaxine 75 mg once daily and she is aware that she should take this medication early in the day in order to avoid sleep disturbance.  She has been asked to return in approximately 4 weeks for an appointment in follow-up of new medication.  Call or return sooner if any problems with the medication or increased anxiety.  If she has any suicidal  thoughts or ideations, she should follow-up in the emergency department. - Ambulatory referral to Social Work - venlafaxine XR (EFFEXOR-XR) 75 MG 24 hr capsule; Take 1 capsule (75 mg total) by mouth daily with breakfast.  Dispense: 30 capsule; Refill: 1    Follow Up Instructions:Return in about 4 weeks (around 05/07/2020) for Hypertension/anxiety/new medication; schedule well exam at patient's convenience.    I discussed the assessment and treatment plan with the patient. The patient was provided an opportunity to ask questions and all were answered. The patient agreed with the plan and demonstrated an understanding of the instructions.   The patient was advised to call back or seek an in-person evaluation if the symptoms worsen or if the condition fails to improve as anticipated.  I provided 12 minutes of non-face-to-face time during this encounter.   Cain Saupe, MD

## 2020-04-15 ENCOUNTER — Other Ambulatory Visit: Payer: Self-pay

## 2020-04-15 ENCOUNTER — Ambulatory Visit (INDEPENDENT_AMBULATORY_CARE_PROVIDER_SITE_OTHER): Payer: Medicaid Other | Admitting: Obstetrics

## 2020-04-15 ENCOUNTER — Encounter: Payer: Self-pay | Admitting: Obstetrics

## 2020-04-15 ENCOUNTER — Other Ambulatory Visit (HOSPITAL_COMMUNITY)
Admission: RE | Admit: 2020-04-15 | Discharge: 2020-04-15 | Disposition: A | Discharge status: Home / Self Care | Payer: Medicaid Other | Source: Ambulatory Visit | Attending: Obstetrics | Admitting: Obstetrics

## 2020-04-15 VITALS — BP 135/86 | HR 74 | Wt 263.0 lb

## 2020-04-15 DIAGNOSIS — Z6841 Body Mass Index (BMI) 40.0 and over, adult: Secondary | ICD-10-CM | POA: Diagnosis not present

## 2020-04-15 DIAGNOSIS — Z113 Encounter for screening for infections with a predominantly sexual mode of transmission: Secondary | ICD-10-CM | POA: Diagnosis not present

## 2020-04-15 DIAGNOSIS — N898 Other specified noninflammatory disorders of vagina: Secondary | ICD-10-CM | POA: Diagnosis not present

## 2020-04-15 DIAGNOSIS — Z3009 Encounter for other general counseling and advice on contraception: Secondary | ICD-10-CM | POA: Diagnosis not present

## 2020-04-15 DIAGNOSIS — I10 Essential (primary) hypertension: Secondary | ICD-10-CM | POA: Diagnosis not present

## 2020-04-15 DIAGNOSIS — Z131 Encounter for screening for diabetes mellitus: Secondary | ICD-10-CM

## 2020-04-15 DIAGNOSIS — Z01419 Encounter for gynecological examination (general) (routine) without abnormal findings: Secondary | ICD-10-CM

## 2020-04-15 DIAGNOSIS — A6000 Herpesviral infection of urogenital system, unspecified: Secondary | ICD-10-CM

## 2020-04-15 NOTE — Addendum Note (Signed)
Addended by: Marya Landry D on: 04/15/2020 09:32 AM   Modules accepted: Orders

## 2020-04-15 NOTE — Progress Notes (Signed)
Subjective:        Gloria Chase is a 29 y.o. female here for a routine exam.  Current complaints: None.    Personal health questionnaire:  Is patient [name], have a family history of breast and/or ovarian cancer: no Is there a family history of uterine cancer diagnosed at age < 83, gastrointestinal cancer, urinary tract cancer, family member who is a Personnel officer syndrome-associated carrier: no Is the patient overweight and hypertensive, family history of diabetes, personal history of gestational diabetes, preeclampsia or PCOS: no Is patient over 71, have PCOS,  family history of premature CHD under age 17, diabetes, smoke, have hypertension or peripheral artery disease:  no At any time, has a partner hit, kicked or otherwise hurt or frightened you?: no Over the past 2 weeks, have you felt down, depressed or hopeless?: no Over the past 2 weeks, have you felt little interest or pleasure in doing things?:no   Gynecologic History Patient's last menstrual period was 04/04/2020. Contraception: none Last Pap: 03-05-2019. Results were: normal Last mammogram: n/a. Results were: n/a  Obstetric History OB History  Gravida Para Term Preterm AB Living  4 2 2  0 2 2  SAB TAB Ectopic Multiple Live Births  1 0 1 0 2    # Outcome Date GA Lbr Len/2nd Weight Sex Delivery Anes PTL Lv  4 Term 02/18/11 [redacted]w[redacted]d 10:50 / 00:25 9 lb 1.7 oz (4.131 kg) M Vag-Spont EPI  LIV  3 Ectopic 02/2010          2 Term 03/2007    F Vag-Spont EPI  LIV  1 SAB 04/2006            Past Medical History:  Diagnosis Date  . Anemia   . History of colposcopy with cervical biopsy 01/11/2011  . History of trichomoniasis   . History of wisdom tooth extraction 2005  . Hx of hernia repair 2000  . Hypertension   . Morbid obesity (HCC)     Past Surgical History:  Procedure Laterality Date  . CERVIX LESION DESTRUCTION  12/2015     Current Outpatient Medications:  .  carvedilol (COREG) 12.5 MG tablet, Take 1  tablet (12.5 mg total) by mouth 2 (two) times daily with a meal., Disp: 180 tablet, Rfl: 1 .  Prenatal 27-1 MG TABS, TAKE 1 TABLET BY MOUTH EVERY DAY BEFORE BREAKFAST, Disp: 30 tablet, Rfl: 11 .  triamterene-hydrochlorothiazide (DYAZIDE) 37.5-25 MG capsule, Take 1 each (1 capsule total) by mouth daily., Disp: 90 capsule, Rfl: 1 .  ibuprofen (ADVIL) 800 MG tablet, TAKE 1 TABLET (800 MG TOTAL) BY MOUTH EVERY 8 (EIGHT) HOURS AS NEEDED FOR CRAMPING., Disp: 60 tablet, Rfl: 5 .  Multiple Vitamin (MULTIVITAMIN) capsule, Take 1 capsule daily by mouth. (Patient not taking: Reported on 04/09/2020), Disp: , Rfl:  .  valACYclovir (VALTREX) 1000 MG tablet, Take 1 tablet (1,000 mg total) by mouth 2 (two) times daily. Take for 3 days, with each outbreak. (Patient not taking: Reported on 10/18/2019), Disp: 30 tablet, Rfl: 5 .  venlafaxine XR (EFFEXOR-XR) 75 MG 24 hr capsule, Take 1 capsule (75 mg total) by mouth daily with breakfast., Disp: 30 capsule, Rfl: 1 No Known Allergies  Social History   Tobacco Use  . Smoking status: Never Smoker  . Smokeless tobacco: Never Used  Substance Use Topics  . Alcohol use: Not Currently    Comment: social    Family History  Problem Relation Age of Onset  . Hypertension Mother   .  Hypertension Father   . Hypertension Maternal Grandmother   . Hypertension Maternal Grandfather   . Colon cancer Maternal Grandfather       Review of Systems  Constitutional: negative for fatigue and weight loss Respiratory: negative for cough and wheezing Cardiovascular: negative for chest pain, fatigue and palpitations Gastrointestinal: negative for abdominal pain and change in bowel habits Musculoskeletal:negative for myalgias Neurological: negative for gait problems and tremors Behavioral/Psych: negative for abusive relationship, depression Endocrine: negative for temperature intolerance    Genitourinary:negative for abnormal menstrual periods, genital lesions, hot flashes, sexual  problems and vaginal discharge Integument/breast: negative for breast lump, breast tenderness, nipple discharge and skin lesion(s)    Objective:       BP 135/86   Pulse 74   Wt 263 lb (119.3 kg)   LMP 04/04/2020   BMI 43.77 kg/m  General:   alert and no distress  Skin:   no rash or abnormalities  Lungs:   clear to auscultation bilaterally  Heart:   regular rate and rhythm, S1, S2 normal, no murmur, click, rub or gallop  Breasts:   normal without suspicious masses, skin or nipple changes or axillary nodes  Abdomen:  normal findings: no organomegaly, soft, non-tender and no hernia  Pelvis:  External genitalia: normal general appearance Urinary system: urethral meatus normal and bladder without fullness, nontender Vaginal: normal without tenderness, induration or masses Cervix: normal appearance Adnexa: normal bimanual exam Uterus: anteverted and non-tender, normal size   Lab Review Urine pregnancy test Labs reviewed no Radiologic studies reviewed no  50% of 20 min visit spent on counseling and coordination of care.   Assessment:     1. Encounter for gynecological examination with Papanicolaou smear of cervix Rx: - Cytology - PAP( [facility])  2. Vaginal discharge Rx: - Cervicovaginal ancillary only( [facility])  3. Screen for STD (sexually transmitted disease) Rx: - HIV Antibody (routine testing w rflx) - Hepatitis B surface antigen - RPR - Hepatitis C antibody  4. Genital herpes simplex, unspecified site - clinically stable  5. Encounter for other general counseling and advice on contraception - declines contraception  6. Class 3 severe obesity due to excess calories without serious comorbidity with body mass index (BMI) of 40.0 to 44.9 in adult [facility]) - program of caloric reduction, exercise and behavior modification recommended  7. HTN (hypertension), benign - managed by PCP   8. Screening for Diabetes - HgbA1C   Plan:    Education reviewed:  calcium supplements, depression evaluation, low fat, low cholesterol diet, safe sex/STD prevention, self breast exams and weight bearing exercise. Contraception: none. Follow up in: 1 year.    Orders Placed This Encounter  Procedures  . HIV Antibody (routine testing w rflx)  . Hepatitis B surface antigen  . RPR  . Hepatitis C antibody    Brock Bad, MD 04/15/2020 8:45 AM

## 2020-04-16 LAB — CERVICOVAGINAL ANCILLARY ONLY
Bacterial Vaginitis (gardnerella): POSITIVE — AB
Candida Glabrata: NEGATIVE
Candida Vaginitis: NEGATIVE
Chlamydia: NEGATIVE
Comment: NEGATIVE
Comment: NEGATIVE
Comment: NEGATIVE
Comment: NEGATIVE
Comment: NEGATIVE
Comment: NORMAL
Neisseria Gonorrhea: NEGATIVE
Trichomonas: NEGATIVE

## 2020-04-16 LAB — RPR: RPR Ser Ql: NONREACTIVE

## 2020-04-16 LAB — HEPATITIS B SURFACE ANTIGEN: Hepatitis B Surface Ag: NEGATIVE

## 2020-04-16 LAB — CYTOLOGY - PAP
Comment: NEGATIVE
Diagnosis: NEGATIVE
High risk HPV: NEGATIVE

## 2020-04-16 LAB — HIV ANTIBODY (ROUTINE TESTING W REFLEX): HIV Screen 4th Generation wRfx: NONREACTIVE

## 2020-04-16 LAB — HEPATITIS C ANTIBODY: Hep C Virus Ab: <0.1 s/co ratio (ref 0.0–0.9)

## 2020-04-17 ENCOUNTER — Other Ambulatory Visit: Payer: Self-pay | Admitting: Obstetrics

## 2020-04-17 DIAGNOSIS — N760 Acute vaginitis: Secondary | ICD-10-CM

## 2020-04-17 MED ORDER — METRONIDAZOLE 500 MG PO TABS: 500.0000 mg | ORAL_TABLET | Freq: Two times a day (BID) | ORAL | 2 refills | Status: DC

## 2020-04-21 ENCOUNTER — Other Ambulatory Visit: Payer: Self-pay

## 2020-04-21 DIAGNOSIS — N760 Acute vaginitis: Secondary | ICD-10-CM

## 2020-04-21 MED ORDER — METRONIDAZOLE 500 MG PO TABS: 500.0000 mg | ORAL_TABLET | Freq: Two times a day (BID) | ORAL | 2 refills | Status: DC

## 2020-04-21 NOTE — Telephone Encounter (Signed)
Patient did not get rx for flagyl. I will go ahead and e-prescribed for patient.

## 2020-04-28 ENCOUNTER — Ambulatory Visit: Payer: Medicaid Other | Attending: Family Medicine | Admitting: Licensed Clinical Social Worker

## 2020-04-28 ENCOUNTER — Other Ambulatory Visit: Payer: Self-pay

## 2020-04-28 DIAGNOSIS — F411 Generalized anxiety disorder: Secondary | ICD-10-CM

## 2020-04-28 NOTE — BH Specialist Note (Signed)
Integrated Behavioral Health Initial Visit  MRN: [id number] Name: Gloria Chase  Number of Integrated Behavioral Health Clinician visits:: 1/6 Session Start time: 11:15  Session End time: 12:00 Total time: 45   Type of Service: Integrated Behavioral Health- Individual/Family Interpretor:No.   SUBJECTIVE: Gloria Chase is a 29 y.o. female accompanied by self Patient was referred by Dr. Jillyn Hidden for generalized anxiety. Patient reports the following symptoms/concerns: Feelings of hopeless, and anxiety triggered by psychosocial stressors.  Duration of problem: ongoing; Severity of problem: moderate  OBJECTIVE: Mood: overwhelmed and Affect: Appropriate Risk of harm to self or others: No plan to harm self or others  LIFE CONTEXT: Family and Social: Pt lives with her two children and her mother; Pt reports the mother creates stress in the home; reports negative relationship with father School/Work: Pt has been employed at current bank job since  Self-Care: Pt reports practicing meditation when possible Life Changes: recent death of grandmother, lack of stable housing  GOALS ADDRESSED: Patient will: 1. Reduce symptoms of: anxiety, depression and stress by continuing brief therapy sessions with Christian 2. Increase knowledge and/or ability of: self-management skills and stress reduction by continuing to search for housing resources and increase self-care 3. Demonstrate ability to: Increase healthy adjustment to current life circumstances and Increase adequate support systems for patient/family by beginning therapy at [facility]  INTERVENTIONS: Interventions utilized: Motivational Interviewing, Solution-Focused Strategies, Brief CBT, Supportive Counseling and Medication Monitoring  Standardized Assessments completed: Not Needed  ASSESSMENT: Patient currently experiencing symptoms of anxiety and depression related to lack of permanent residence. Pt currently  lives with mother but experiences harassment and a daily uncomfortable living situation for herself and her children. Pt reports that mother is bi-polar but doesn't seek treatment, and that she has previously called the police on Pt to kick her out of the house in front of the children. Pt has been working hard to find housing for herself and her children, but the Pt has experienced difficultly finding housing due to previous convictions/arrests. Pt has also lived with her father and grandmother but those living situations also created tension, and her grandmother has since passed away. Patient may benefit from beginning brief therapy with MSW intern Ephriam Knuckles and potentially moving into long-term therapy at [facility].  PLAN: 1. Follow up with behavioral health clinician on : 05/06/20 at 1:30pm 2. Behavioral recommendations: practice self-care with making time for herself and practicing meditation. 3. Referral(s): Integrated Art gallery manager (In Clinic) and [employer]:  Housing [facility] [place]

## 2020-05-03 ENCOUNTER — Encounter: Payer: Self-pay | Admitting: Family Medicine

## 2020-05-03 ENCOUNTER — Ambulatory Visit: Payer: Medicaid Other | Attending: Family Medicine | Admitting: Family Medicine

## 2020-05-03 ENCOUNTER — Other Ambulatory Visit: Payer: Self-pay

## 2020-05-03 VITALS — BP 101/69 | HR 81 | Temp 97.7°F | Ht 65.0 in | Wt 258.4 lb

## 2020-05-03 DIAGNOSIS — Z6841 Body Mass Index (BMI) 40.0 and over, adult: Secondary | ICD-10-CM | POA: Diagnosis not present

## 2020-05-03 DIAGNOSIS — Z862 Personal history of diseases of the blood and blood-forming organs and certain disorders involving the immune mechanism: Secondary | ICD-10-CM

## 2020-05-03 DIAGNOSIS — R739 Hyperglycemia, unspecified: Secondary | ICD-10-CM

## 2020-05-03 DIAGNOSIS — R7309 Other abnormal glucose: Secondary | ICD-10-CM | POA: Diagnosis not present

## 2020-05-03 DIAGNOSIS — Z833 Family history of diabetes mellitus: Secondary | ICD-10-CM | POA: Diagnosis not present

## 2020-05-03 DIAGNOSIS — Z0000 Encounter for general adult medical examination without abnormal findings: Secondary | ICD-10-CM | POA: Diagnosis not present

## 2020-05-03 NOTE — Progress Notes (Signed)
Established Patient Office Visit  Subjective:  Patient ID: Gloria Chase, female    DOB: May 29, 1991  Age: 29 y.o. MRN: [id number]  CC:  Chief Complaint  Patient presents with  . Annual Exam    HPI Gloria Chase, 29 yo female, who presents for annual well exam. She reports that her pap/pelvic exam are up to date with GYN.  She reports no significant issues at today's visit.  She does have some fatigue and has been anemic in the past.  She has had elevated prior blood sugars but denies any increased thirst or frequent urination.  She does have a family history significant for diabetes.  Past Medical History:  Diagnosis Date  . Anemia   . History of colposcopy with cervical biopsy 01/11/2011  . History of trichomoniasis   . History of wisdom tooth extraction 2005  . Hx of hernia repair 2000  . Hypertension   . Morbid obesity (HCC)     Past Surgical History:  Procedure Laterality Date  . CERVIX LESION DESTRUCTION  12/2015    Family History  Problem Relation Age of Onset  . Hypertension Mother   . Hypertension Father   . Hypertension Maternal Grandmother   . Hypertension Maternal Grandfather   . Colon cancer Maternal Grandfather     Social History   Socioeconomic History  . Marital status: Single    Spouse name: Not on file  . Number of children: Not on file  . Years of education: Not on file  . Highest education level: Not on file  Occupational History  . Not on file  Tobacco Use  . Smoking status: Never Smoker  . Smokeless tobacco: Never Used  Vaping Use  . Vaping Use: Never used  Substance and Sexual Activity  . Alcohol use: Not Currently    Comment: social  . Drug use: Yes    Types: Marijuana  . Sexual activity: Yes    Partners: Male    Birth control/protection: None  Other Topics Concern  . Not on file  Social History Narrative  . Not on file   Social Determinants of Health   Financial Resource Strain:   . Difficulty of Paying Living  Expenses: Not on file  Food Insecurity:   . Worried About Programme researcher, broadcasting/film/video in the Last Year: Not on file  . Ran Out of Food in the Last Year: Not on file  Transportation Needs:   . Lack of Transportation (Medical): Not on file  . Lack of Transportation (Non-Medical): Not on file  Physical Activity:   . Days of Exercise per Week: Not on file  . Minutes of Exercise per Session: Not on file  Stress:   . Feeling of Stress : Not on file  Social Connections:   . Frequency of Communication with Friends and Family: Not on file  . Frequency of Social Gatherings with Friends and Family: Not on file  . Attends Religious Services: Not on file  . Active Member of Clubs or Organizations: Not on file  . Attends Banker Meetings: Not on file  . Marital Status: Not on file  Intimate Partner Violence:   . Fear of Current or Ex-Partner: Not on file  . Emotionally Abused: Not on file  . Physically Abused: Not on file  . Sexually Abused: Not on file    Outpatient Medications Prior to Visit  Medication Sig Dispense Refill  . carvedilol (COREG) 12.5 MG tablet Take 1 tablet (12.5 mg total)  by mouth 2 (two) times daily with a meal. 180 tablet 1  . ibuprofen (ADVIL) 800 MG tablet TAKE 1 TABLET (800 MG TOTAL) BY MOUTH EVERY 8 (EIGHT) HOURS AS NEEDED FOR CRAMPING. 60 tablet 5  . Prenatal 27-1 MG TABS TAKE 1 TABLET BY MOUTH EVERY DAY BEFORE BREAKFAST 30 tablet 11  . triamterene-hydrochlorothiazide (DYAZIDE) 37.5-25 MG capsule Take 1 each (1 capsule total) by mouth daily. 90 capsule 1  . metroNIDAZOLE (FLAGYL) 500 MG tablet Take 1 tablet (500 mg total) by mouth 2 (two) times daily. (Patient not taking: Reported on 05/03/2020) 14 tablet 2  . Multiple Vitamin (MULTIVITAMIN) capsule Take 1 capsule daily by mouth. (Patient not taking: Reported on 04/09/2020)    . valACYclovir (VALTREX) 1000 MG tablet Take 1 tablet (1,000 mg total) by mouth 2 (two) times daily. Take for 3 days, with each outbreak.  (Patient not taking: Reported on 10/18/2019) 30 tablet 5  . venlafaxine XR (EFFEXOR-XR) 75 MG 24 hr capsule Take 1 capsule (75 mg total) by mouth daily with breakfast. (Patient not taking: Reported on 05/03/2020) 30 capsule 1   No facility-administered medications prior to visit.    No Known Allergies  ROS Review of Systems  Constitutional: Positive for fatigue (mild). Negative for chills and fever.  HENT: Negative for sore throat and trouble swallowing.   Eyes: Negative for photophobia and visual disturbance.  Respiratory: Negative for cough and shortness of breath.   Cardiovascular: Negative for chest pain.  Gastrointestinal: Negative for abdominal pain, blood in stool, constipation, diarrhea and nausea.  Endocrine: Negative for polydipsia, polyphagia and polyuria.  Genitourinary: Negative for dysuria and frequency.  Musculoskeletal: Negative for arthralgias and gait problem.  Neurological: Negative for dizziness and headaches.  Hematological: Negative for adenopathy.  Psychiatric/Behavioral: Negative for suicidal ideas. The patient is not nervous/anxious.       Objective:    Physical Exam Vitals and nursing note reviewed.  Constitutional:      General: She is not in acute distress.    Appearance: Normal appearance. She is obese.  Neck:     Vascular: No carotid bruit.  Cardiovascular:     Rate and Rhythm: Normal rate and regular rhythm.  Pulmonary:     Effort: Pulmonary effort is normal.     Breath sounds: Normal breath sounds.  Abdominal:     Palpations: Abdomen is soft.     Tenderness: There is no abdominal tenderness. There is no right CVA tenderness, left CVA tenderness, guarding or rebound.  Musculoskeletal:        General: No tenderness.     Cervical back: Normal range of motion and neck supple. No tenderness.     Right lower leg: No edema.     Left lower leg: No edema.  Lymphadenopathy:     Cervical: No cervical adenopathy.  Skin:    General: Skin is warm.    Neurological:     General: No focal deficit present.     Mental Status: She is alert and oriented to person, place, and time.  Psychiatric:        Mood and Affect: Mood normal.        Behavior: Behavior normal.     BP 101/69 (BP Location: Right Arm, Patient Position: Sitting)   Pulse 81   Temp 97.7 F (36.5 C)   Ht 5\' 5"  (1.651 m)   Wt 258 lb 6.4 oz (117.2 kg)   LMP 04/04/2020   SpO2 99%   BMI 43.00 kg/m  Wt  Readings from Last 3 Encounters:  05/03/20 258 lb 6.4 oz (117.2 kg)  04/15/20 263 lb (119.3 kg)  11/04/19 270 lb 12.8 oz (122.8 kg)     Health Maintenance Due  Topic Date Due  . COVID-19 Vaccine (1) Never done  . TETANUS/TDAP  Never done  . INFLUENZA VACCINE  Never done     No results found for: TSH Lab Results  Component Value Date   WBC 3.8 (L) 10/18/2019   HGB 13.7 10/18/2019   HCT 45.1 10/18/2019   MCV 90.2 10/18/2019   PLT 255 10/18/2019   Lab Results  Component Value Date   NA 138 10/18/2019   K 3.6 10/18/2019   CO2 29 10/18/2019   GLUCOSE 124 (H) 10/18/2019   BUN 12 10/18/2019   CREATININE 0.97 10/18/2019   BILITOT 0.2 (L) 03/22/2014   ALKPHOS 76 03/22/2014   AST 14 03/22/2014   ALT 5 03/22/2014   PROT 8.0 03/22/2014   ALBUMIN 3.4 (L) 03/22/2014   CALCIUM 8.4 (L) 10/18/2019   ANIONGAP 7 10/18/2019   No results found for: CHOL No results found for: HDL No results found for: LDLCALC No results found for: TRIG No results found for: CHOLHDL No results found for: MPNT6R    Assessment & Plan:  1. Well female exam without gynecological exam Educational material on health maintenance for females provided as part of after visit summary.  Patient will have hemoglobin A1c at today's visit due to a family history of diabetes as well as patient with past elevated random blood sugar.  Patient will also have lipid panel as a screening test for lipid disorders as patient also with obesity which increases her risk of hyperlipidemia.  Patient reports  that her Pap/pelvic exam are up-to-date with her GYN.  - Hemoglobin A1c - Lipid panel  2. Family history of diabetes mellitus (DM); 5. Elevated random blood glucose level Patient will have hemoglobin A1c at today's visit due to family history of diabetes and in April of this year, patient with elevated glucose of 124. - Hemoglobin A1c  3. Morbid obesity with BMI of 40.0-44.9, adult [facility]) Patient with morbid obesity and discussed referral to medical weight management program which patient agreed to do today's visit.  Patient has also had past elevated random blood sugars and has family history of diabetes and will have hemoglobin A1c at today's visit.  She will additionally have screening for lipid disorders via lipid panel due to her obesity and increased risk of hyperlipidemia.  Will check thyroid panel with TSH to see if an underactive thyroid may be contributing to her issues with obesity. - Hemoglobin A1c - Lipid panel - Thyroid Panel With TSH - Amb Ref to Medical Weight Management  4. History of anemia Patient with past history of anemia for which CBC will be done at today's visit and she will be notified regarding the results and if further interventions such as iron therapy will be needed based on the results. - CBC    Follow-up: Return in about 6 months (around 11/01/2020) for chronic medical issues.   Cain Saupe, MD

## 2020-05-03 NOTE — Patient Instructions (Signed)
Health Maintenance, Female Adopting a healthy lifestyle and getting preventive care are important in promoting health and wellness. Ask your health care provider about:  The right schedule for you to have regular tests and exams.  Things you can do on your own to prevent diseases and keep yourself healthy. What should I know about diet, weight, and exercise? Eat a healthy diet   Eat a diet that includes plenty of vegetables, fruits, low-fat dairy products, and lean protein.  Do not eat a lot of foods that are high in solid fats, added sugars, or sodium. Maintain a healthy weight Body mass index (BMI) is used to identify weight problems. It estimates body fat based on height and weight. Your health care provider can help determine your BMI and help you achieve or maintain a healthy weight. Get regular exercise Get regular exercise. This is one of the most important things you can do for your health. Most adults should:  Exercise for at least 150 minutes each week. The exercise should increase your heart rate and make you sweat (moderate-intensity exercise).  Do strengthening exercises at least twice a week. This is in addition to the moderate-intensity exercise.  Spend less time sitting. Even light physical activity can be beneficial. Watch cholesterol and blood lipids Have your blood tested for lipids and cholesterol at 31 years of age, then have this test every 5 years. Have your cholesterol levels checked more often if:  Your lipid or cholesterol levels are high.  You are older than 31 years of age.  You are at high risk for heart disease. What should I know about cancer screening? Depending on your health history and family history, you may need to have cancer screening at various ages. This may include screening for:  Breast cancer.  Cervical cancer.  Colorectal cancer.  Skin cancer.  Lung cancer. What should I know about heart disease, diabetes, and high blood  pressure? Blood pressure and heart disease  High blood pressure causes heart disease and increases the risk of stroke. This is more likely to develop in people who have high blood pressure readings, are of African descent, or are overweight.  Have your blood pressure checked: ? Every 3-5 years if you are 18-39 years of age. ? Every year if you are 40 years old or older. Diabetes Have regular diabetes screenings. This checks your fasting blood sugar level. Have the screening done:  Once every three years after age 40 if you are at a normal weight and have a low risk for diabetes.  More often and at a younger age if you are overweight or have a high risk for diabetes. What should I know about preventing infection? Hepatitis B If you have a higher risk for hepatitis B, you should be screened for this virus. Talk with your health care provider to find out if you are at risk for hepatitis B infection. Hepatitis C Testing is recommended for:  Everyone born from 1945 through 1965.  Anyone with known risk factors for hepatitis C. Sexually transmitted infections (STIs)  Get screened for STIs, including gonorrhea and chlamydia, if: ? You are sexually active and are younger than 31 years of age. ? You are older than 31 years of age and your health care provider tells you that you are at risk for this type of infection. ? Your sexual activity has changed since you were last screened, and you are at increased risk for chlamydia or gonorrhea. Ask your health care provider if   you are at risk.  Ask your health care provider about whether you are at high risk for HIV. Your health care provider may recommend a prescription medicine to help prevent HIV infection. If you choose to take medicine to prevent HIV, you should first get tested for HIV. You should then be tested every 3 months for as long as you are taking the medicine. Pregnancy  If you are about to stop having your period (premenopausal) and  you may become pregnant, seek counseling before you get pregnant.  Take 400 to 800 micrograms (mcg) of folic acid every day if you become pregnant.  Ask for birth control (contraception) if you want to prevent pregnancy. Osteoporosis and menopause Osteoporosis is a disease in which the bones lose minerals and strength with aging. This can result in bone fractures. If you are 65 years old or older, or if you are at risk for osteoporosis and fractures, ask your health care provider if you should:  Be screened for bone loss.  Take a calcium or vitamin D supplement to lower your risk of fractures.  Be given hormone replacement therapy (HRT) to treat symptoms of menopause. Follow these instructions at home: Lifestyle  Do not use any products that contain nicotine or tobacco, such as cigarettes, e-cigarettes, and chewing tobacco. If you need help quitting, ask your health care provider.  Do not use street drugs.  Do not share needles.  Ask your health care provider for help if you need support or information about quitting drugs. Alcohol use  Do not drink alcohol if: ? Your health care provider tells you not to drink. ? You are pregnant, may be pregnant, or are planning to become pregnant.  If you drink alcohol: ? Limit how much you use to 0-1 drink a day. ? Limit intake if you are breastfeeding.  Be aware of how much alcohol is in your drink. In the [place] one drink equals one 12 oz bottle of beer (355 mL), one 5 oz glass of wine (148 mL), or one 1 oz glass of hard liquor (44 mL). General instructions  Schedule regular health, dental, and eye exams.  Stay current with your vaccines.  Tell your health care provider if: ? You often feel depressed. ? You have ever been abused or do not feel safe at home. Summary  Adopting a healthy lifestyle and getting preventive care are important in promoting health and wellness.  Follow your health care provider's instructions about healthy  diet, exercising, and getting tested or screened for diseases.  Follow your health care provider's instructions on monitoring your cholesterol and blood pressure. This information is not intended to replace advice given to you by your health care provider. Make sure you discuss any questions you have with your health care provider. Document Revised: 06/26/2018 Document Reviewed: 06/26/2018 Elsevier Patient Education  2020 Elsevier Inc[employer]  

## 2020-05-03 NOTE — Progress Notes (Signed)
Here for physical. 

## 2020-05-04 ENCOUNTER — Encounter: Payer: Self-pay | Admitting: Family Medicine

## 2020-05-04 LAB — CBC
Hematocrit: 41.1 % (ref 34.0–46.6)
Hemoglobin: 12.9 g/dL (ref 11.1–15.9)
MCH: 28 pg (ref 26.6–33.0)
MCHC: 31.4 g/dL — ABNORMAL LOW (ref 31.5–35.7)
MCV: 89 fL (ref 79–97)
Platelets: 303 x10E3/uL (ref 150–450)
RBC: 4.6 x10E6/uL (ref 3.77–5.28)
RDW: 12.8 % (ref 11.7–15.4)
WBC: 6 x10E3/uL (ref 3.4–10.8)

## 2020-05-04 LAB — LIPID PANEL
Chol/HDL Ratio: 2.6 ratio (ref 0.0–4.4)
Cholesterol, Total: 102 mg/dL (ref 100–199)
HDL: 39 mg/dL — ABNORMAL LOW
LDL Chol Calc (NIH): 45 mg/dL (ref 0–99)
Triglycerides: 94 mg/dL (ref 0–149)
VLDL Cholesterol Cal: 18 mg/dL (ref 5–40)

## 2020-05-04 LAB — HEMOGLOBIN A1C
Est. average glucose Bld gHb Est-mCnc: 131 mg/dL
Hgb A1c MFr Bld: 6.2 % — ABNORMAL HIGH (ref 4.8–5.6)

## 2020-05-04 LAB — THYROID PANEL WITH TSH
Free Thyroxine Index: 1.8 (ref 1.2–4.9)
T3 Uptake Ratio: 26 % (ref 24–39)
T4, Total: 7.1 ug/dL (ref 4.5–12.0)
TSH: 0.444 u[IU]/mL — ABNORMAL LOW (ref 0.450–4.500)

## 2020-05-06 ENCOUNTER — Ambulatory Visit: Payer: Medicaid Other | Admitting: Licensed Clinical Social Worker

## 2020-05-06 ENCOUNTER — Other Ambulatory Visit: Payer: Self-pay

## 2020-05-07 ENCOUNTER — Other Ambulatory Visit: Payer: Self-pay | Admitting: Family Medicine

## 2020-05-07 DIAGNOSIS — Z6841 Body Mass Index (BMI) 40.0 and over, adult: Secondary | ICD-10-CM

## 2020-05-07 DIAGNOSIS — R7303 Prediabetes: Secondary | ICD-10-CM

## 2020-05-07 NOTE — Progress Notes (Signed)
Patient ID: Gloria Chase, female   DOB: 01-24-1991, 29 y.o.   MRN: [id number]  Patient with hemoglobin of 6.2.  She is not interested in starting medication such as Metformin at this time but would like referral to a nutritionist.

## 2020-05-12 ENCOUNTER — Other Ambulatory Visit: Payer: Self-pay

## 2020-05-12 ENCOUNTER — Ambulatory Visit: Payer: Medicaid Other | Attending: Family Medicine | Admitting: Licensed Clinical Social Worker

## 2020-05-12 DIAGNOSIS — F418 Other specified anxiety disorders: Secondary | ICD-10-CM | POA: Insufficient documentation
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
## 2020-05-19 ENCOUNTER — Ambulatory Visit: Payer: Medicaid Other | Attending: Family Medicine | Admitting: Licensed Clinical Social Worker

## 2020-05-19 DIAGNOSIS — F331 Major depressive disorder, recurrent, moderate: Secondary | ICD-10-CM

## 2020-05-20 ENCOUNTER — Other Ambulatory Visit: Payer: Self-pay

## 2020-05-20 DIAGNOSIS — F331 Major depressive disorder, recurrent, moderate: Secondary | ICD-10-CM | POA: Insufficient documentation
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
## 2020-05-26 ENCOUNTER — Ambulatory Visit: Payer: Medicaid Other | Attending: Family Medicine | Admitting: Licensed Clinical Social Worker

## 2020-05-26 ENCOUNTER — Other Ambulatory Visit: Payer: Self-pay

## 2020-05-26 DIAGNOSIS — F411 Generalized anxiety disorder: Secondary | ICD-10-CM
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
## 2020-06-02 ENCOUNTER — Ambulatory Visit: Payer: Medicaid Other | Attending: Family Medicine | Admitting: Licensed Clinical Social Worker

## 2020-06-02 ENCOUNTER — Telehealth: Payer: Self-pay | Admitting: Licensed Clinical Social Worker

## 2020-06-02 ENCOUNTER — Other Ambulatory Visit: Payer: Self-pay

## 2020-06-02 ENCOUNTER — Ambulatory Visit: Payer: Medicaid Other | Admitting: Registered"

## 2020-06-02 DIAGNOSIS — F339 Major depressive disorder, recurrent, unspecified: Secondary | ICD-10-CM

## 2020-06-02 DIAGNOSIS — R4586 Emotional lability: Secondary | ICD-10-CM

## 2020-06-02 NOTE — Telephone Encounter (Signed)
Completed Legal Aid referral submitted via e-mail 

## 2020-06-02 NOTE — Telephone Encounter (Signed)
Legal Aid referral placed for Pt.

## 2020-06-03 NOTE — BH Specialist Note (Signed)
Integrated Behavioral Health Follow Up Visit  MRN: [id number] Name: Gloria Chase  Number of Integrated Behavioral Health Clinician visits: 5/6 Session Start time: 2:30  Session End time: 3:30 Total time: 60  Type of Service: Integrated Behavioral Health- Individual Interpretor:No.   SUBJECTIVE: Gloria Chase is a 29 y.o. female accompanied by self over the phone. Patient was referred by Dr. Jillyn Hidden for depression/anxiety. Patient reports the following symptoms/concerns: Feeling like "something is missing", like something bad may happen soon, and a lack of enjoyment in daily life. Duration of problem: ongoing; Severity of problem: severe  OBJECTIVE: Mood: Anxious and Depressed and Affect: Depressed and Tearful Risk of harm to self or others: No plan to harm self or others  LIFE CONTEXT: Family and Social: Pt reports feeling detatched from family with minimal outside supports. School/Work: Getting updated private insurance at work soon, still attending job daily. Self-Care: Currently not doing much for self-care, Pt and MSW intern identified ways to better this. Life Changes: Feeling more down than normal.  GOALS ADDRESSED: Patient will: 1.  Reduce symptoms of: anxiety and depression by practicing deep breathing and meditation. 2.  Increase knowledge and/or ability of: coping skills and self-management skills by increasing activities for self, including attending yoga/dance classes. 3.  Demonstrate ability to: Increase healthy adjustment to current life circumstances, Increase adequate support systems for patient/family and Begin healthy grieving over loss by by following up with provided recommended resources.  INTERVENTIONS: Interventions utilized:  Motivational Interviewing, Solution-Focused Strategies, Supportive Counseling and Link to [facility] Standardized Assessments completed: Not Needed  ASSESSMENT: Patient currently experiencing uncontrollable mood  swings, feeling a lack of enjoyment from things she used to enjoy, and racing thoughts/heart.   Patient may benefit from attending appt with Dr. Cato Mulligan for anti-anxiety meds and to discuss panic-attack symptoms (racing thoughts, racing heart). Pt was provided resources to [facility] for [facility] therapy and [facility] for grief support groups.  PLAN: 1. Follow up with behavioral health clinician on : December 1st 2. Behavioral recommendations: follow up on provided resources, take time out for self-care. 3. Referral(s): Paramedic (LME/Outside Clinic), Psychiatrist and Counselor  Aldine Contes  [facility] Intern

## 2020-06-04 ENCOUNTER — Ambulatory Visit: Payer: Medicaid Other | Admitting: Dietician

## 2020-06-08 ENCOUNTER — Ambulatory Visit: Payer: Medicaid Other | Admitting: Internal Medicine

## 2020-06-16 ENCOUNTER — Telehealth: Payer: Self-pay | Admitting: Licensed Clinical Social Worker

## 2020-06-16 ENCOUNTER — Ambulatory Visit: Payer: Medicaid Other | Admitting: Licensed Clinical Social Worker

## 2020-06-16 NOTE — Telephone Encounter (Signed)
BH Intern called pt for weekly appt, but pt didn't answer the phone and was unavailable. Intern left message and call back number.

## 2020-06-21 ENCOUNTER — Telehealth: Payer: Self-pay | Admitting: Licensed Clinical Social Worker

## 2020-06-21 NOTE — Telephone Encounter (Signed)
MSW Intern called and left message to check in after pt missed appt the previous week, and left callback number if the pt wanted to reschedule or if they needed anything.

## 2020-07-01 ENCOUNTER — Telehealth: Payer: Self-pay

## 2020-07-01 NOTE — Telephone Encounter (Signed)
Returned call, advised pt that she has refills for metronidazole at the pharmacy

## 2020-07-03 IMAGING — DX DG CHEST 1V PORT
1 series · 1 of 1 positions shown · non-contrast
Comparison: Prior chest x-ray 07/19/2013

CLINICAL DATA: 28-year-old female with chest pain, shortness of
breath, productive cough and headache

EXAM:
PORTABLE CHEST 1 VIEW

[chest ap]
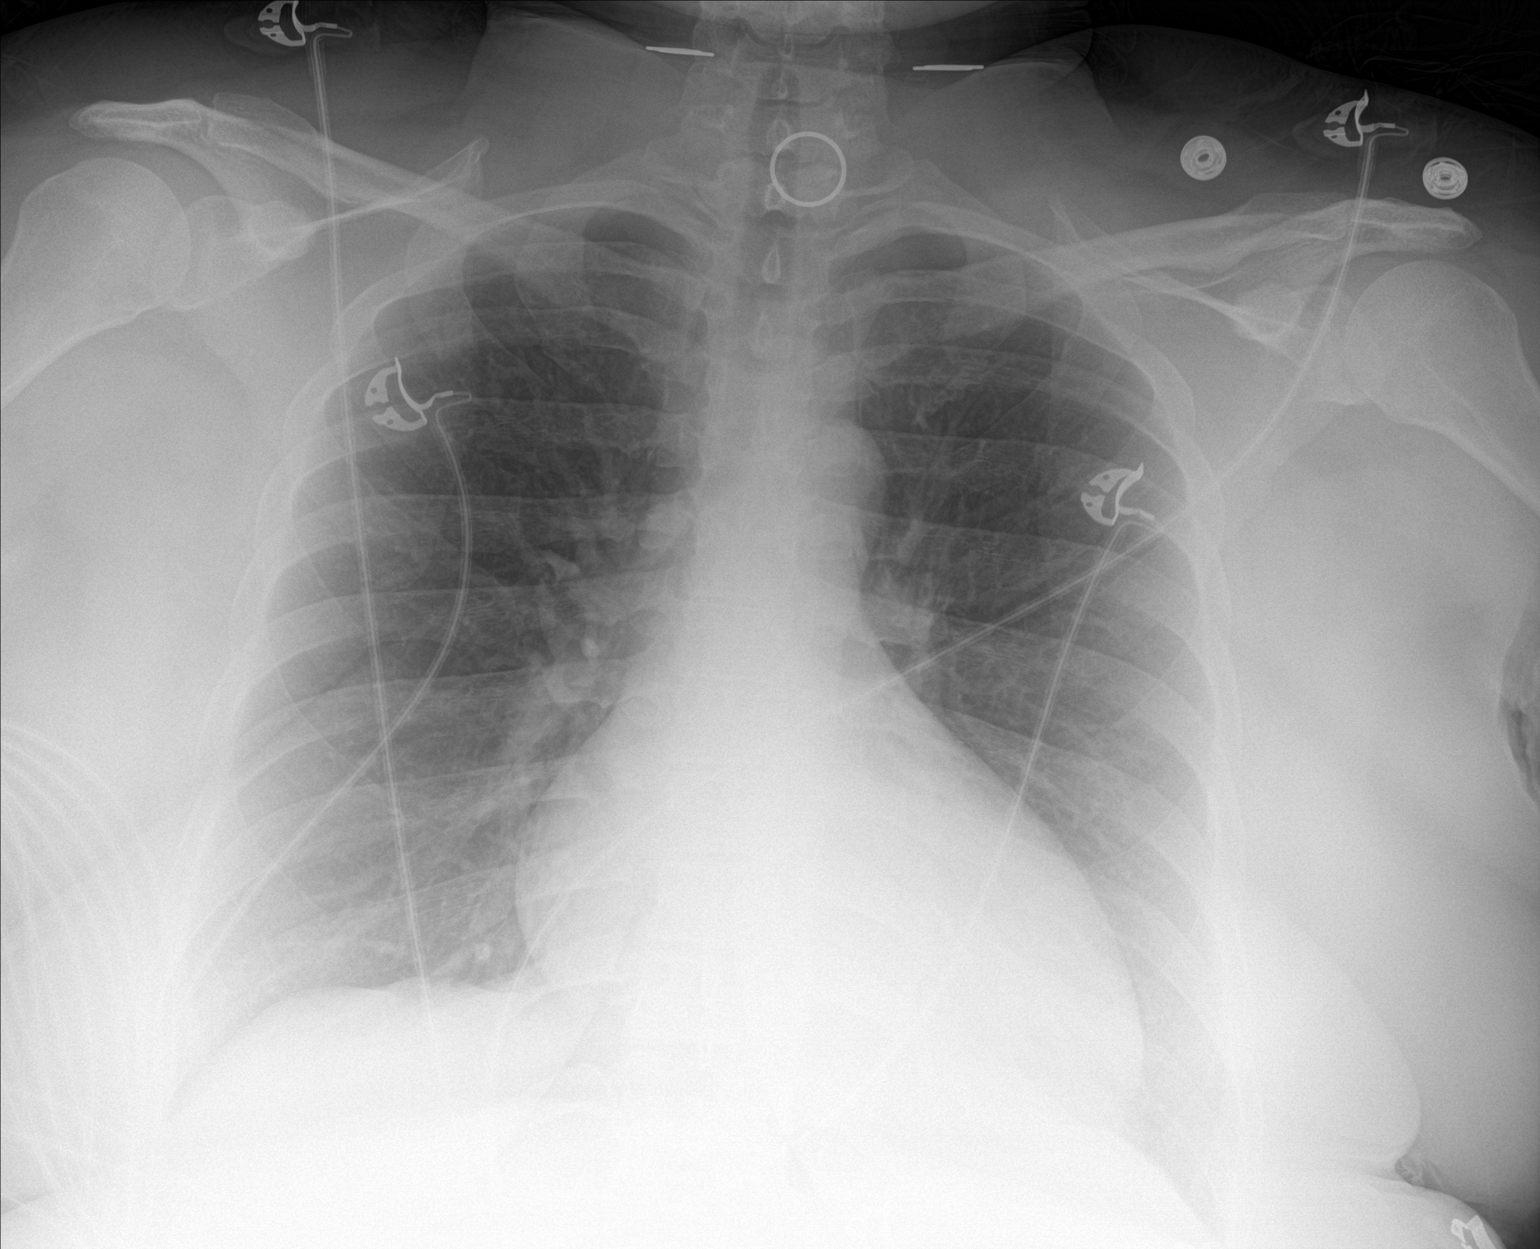

[1 of 1 positions shown; findings below may reference images not displayed]

FINDINGS: The lungs are clear and negative for focal airspace consolidation,
pulmonary edema or suspicious pulmonary nodule. No pleural effusion
or pneumothorax. Cardiac and mediastinal contours are within normal
limits. No acute fracture or lytic or blastic osseous lesions. The
visualized upper abdominal bowel gas pattern is unremarkable.
IMPRESSION: No active disease.

## 2020-07-28 ENCOUNTER — Encounter (INDEPENDENT_AMBULATORY_CARE_PROVIDER_SITE_OTHER): Payer: Self-pay

## 2020-08-03 ENCOUNTER — Encounter (INDEPENDENT_AMBULATORY_CARE_PROVIDER_SITE_OTHER): Payer: Self-pay

## 2020-08-05 ENCOUNTER — Ambulatory Visit (INDEPENDENT_AMBULATORY_CARE_PROVIDER_SITE_OTHER): Payer: Self-pay | Admitting: Family Medicine

## 2020-08-11 ENCOUNTER — Ambulatory Visit: Payer: Medicaid Other | Admitting: Family Medicine

## 2020-08-11 ENCOUNTER — Other Ambulatory Visit: Payer: Self-pay

## 2020-08-11 ENCOUNTER — Ambulatory Visit: Payer: Medicaid Other | Attending: Physician Assistant | Admitting: Physician Assistant

## 2020-08-11 DIAGNOSIS — R7989 Other specified abnormal findings of blood chemistry: Secondary | ICD-10-CM | POA: Diagnosis not present

## 2020-08-11 DIAGNOSIS — R739 Hyperglycemia, unspecified: Secondary | ICD-10-CM

## 2020-08-11 NOTE — Progress Notes (Signed)
Virtual Visit via Telephone Note  I connected with Gloria Chase on 08/11/20 at  8:30 AM EST by telephone and verified that I am speaking with the correct person using two identifiers.  Location: Patient: home Provider: [facility] office   I discussed the limitations, risks, security and privacy concerns of performing an evaluation and management service by telephone and the availability of in person appointments. I also discussed with the patient that there may be a patient responsible charge related to this service. The patient expressed understanding and agreed to proceed.   History of Present Illness:  3 month f/up Patient is going to a nutritionist to help with her blood sugars bc her last A1C was 6.2 in 04/2020.  She is not checking her blood sugars but is working on diet changes.    She also had some abnormalities of TSH on last visit with normal T3 and T4    Observations/Objective:  NAD.  A&Ox3  Assessment and Plan: 1. Hyperglycemia I have had a lengthy discussion and provided education about insulin resistance and the intake of too much sugar/refined carbohydrates.  I have advised the patient to work at a goal of eliminating sugary drinks, candy, desserts, sweets, refined sugars, processed foods, and white carbohydrates.  The patient expresses understanding.  - Hemoglobin A1c; Future  2. Abnormal TSH - Thyroid Panel With TSH; Future    Follow Up Instructions: Assign new PCP 2-3 months   I discussed the assessment and treatment plan with the patient. The patient was provided an opportunity to ask questions and all were answered. The patient agreed with the plan and demonstrated an understanding of the instructions.   The patient was advised to call back or seek an in-person evaluation if the symptoms worsen or if the condition fails to improve as anticipated.  I provided 14 minutes of non-face-to-face time during this encounter.   Georgian Co, PA-C  Patient ID:  Gloria Chase, female   DOB: 1990/07/31, 30 y.o.   MRN: [id number]

## 2020-08-19 ENCOUNTER — Ambulatory Visit (INDEPENDENT_AMBULATORY_CARE_PROVIDER_SITE_OTHER): Payer: Self-pay | Admitting: Family Medicine

## 2020-10-15 ENCOUNTER — Other Ambulatory Visit: Payer: Self-pay | Admitting: Obstetrics

## 2020-10-15 DIAGNOSIS — A6000 Herpesviral infection of urogenital system, unspecified: Secondary | ICD-10-CM

## 2020-11-02 ENCOUNTER — Other Ambulatory Visit: Payer: Self-pay | Admitting: Internal Medicine

## 2020-11-02 DIAGNOSIS — N944 Primary dysmenorrhea: Secondary | ICD-10-CM

## 2020-11-02 DIAGNOSIS — I10 Essential (primary) hypertension: Secondary | ICD-10-CM

## 2020-11-02 MED ORDER — TRIAMTERENE-HCTZ 37.5-25 MG PO CAPS: 1.0000 | ORAL_CAPSULE | Freq: Every day | ORAL | 0 refills | Status: DC

## 2020-11-02 NOTE — Telephone Encounter (Signed)
Requested medication (s) are due for refill today- yes  Requested medication (s) are on the active medication list -yes  Future visit scheduled -yes  Last refill: 10/21/19- Rx expired  Notes to clinic: Request RF of Rx - expired/outside provider  Requested Prescriptions  Pending Prescriptions Disp Refills   ibuprofen (ADVIL) 800 MG tablet 60 tablet 5    Sig: Take 1 tablet (800 mg total) by mouth every 8 (eight) hours as needed for cramping.      Analgesics:  NSAIDS Failed - 11/02/2020 12:16 PM      Failed - Cr in normal range and within 360 days    Creatinine, Ser  Date Value Ref Range Status  10/18/2019 0.97 0.44 - 1.00 mg/dL Final          Passed - HGB in normal range and within 360 days    Hemoglobin  Date Value Ref Range Status  05/03/2020 12.9 11.1 - 15.9 g/dL Final          Passed - Patient is not pregnant      Passed - Valid encounter within last 12 months    Recent Outpatient Visits           2 months ago Hyperglycemia   [facility] [place], Marzella Schlein, [place]   6 months ago Well female exam without gynecological exam   [facility] [place], [place], [place]   6 months ago GAD (generalized anxiety disorder)   [facility], [place], [place]       Future Appointments             In 1 month Marcine Matar, MD [facility]              Signed Prescriptions Disp Refills   triamterene-hydrochlorothiazide (DYAZIDE) 37.5-25 MG capsule 90 capsule 0    Sig: Take 1 each (1 capsule total) by mouth daily.      Cardiovascular: Diuretic Combos Failed - 11/02/2020 12:16 PM      Failed - K in normal range and within 360 days    Potassium  Date Value Ref Range Status  10/18/2019 3.6 3.5 - 5.1 mmol/L Final          Failed - Na in normal range and within 360 days    Sodium  Date Value Ref Range Status  10/18/2019 138 135 - 145 mmol/L Final           Failed - Cr in normal range and within 360 days    Creatinine, Ser  Date Value Ref Range Status  10/18/2019 0.97 0.44 - 1.00 mg/dL Final          Failed - Ca in normal range and within 360 days    Calcium  Date Value Ref Range Status  10/18/2019 8.4 (L) 8.9 - 10.3 mg/dL Final          Passed - Last BP in normal range    BP Readings from Last 1 Encounters:  05/03/20 101/69          Passed - Valid encounter within last 6 months    Recent Outpatient Visits           2 months ago Hyperglycemia   [facility] [place], Marzella Schlein, [place]   6 months ago Well female exam without gynecological exam   [facility] [place] Sau[place], [place]  6 months ago GAD (generalized anxiety disorder)   [facility] [place], [place], [place]       Future Appointments             In 1 month Laural Benes, Binnie Rail, MD [facility]                 Requested Prescriptions  Pending Prescriptions Disp Refills   ibuprofen (ADVIL) 800 MG tablet 60 tablet 5    Sig: Take 1 tablet (800 mg total) by mouth every 8 (eight) hours as needed for cramping.      Analgesics:  NSAIDS Failed - 11/02/2020 12:16 PM      Failed - Cr in normal range and within 360 days    Creatinine, Ser  Date Value Ref Range Status  10/18/2019 0.97 0.44 - 1.00 mg/dL Final          Passed - HGB in normal range and within 360 days    Hemoglobin  Date Value Ref Range Status  05/03/2020 12.9 11.1 - 15.9 g/dL Final          Passed - Patient is not pregnant      Passed - Valid encounter within last 12 months    Recent Outpatient Visits           2 months ago Hyperglycemia   [facility] [place], Marzella Schlein, [place]   6 months ago Well female exam without gynecological exam   [facility], [place], [place]   6 months ago GAD (generalized anxiety disorder)   [facility], [place], [place]       Future Appointments             In 1 month Marcine Matar, MD [facility]              Signed Prescriptions Disp Refills   triamterene-hydrochlorothiazide (DYAZIDE) 37.5-25 MG capsule 90 capsule 0    Sig: Take 1 each (1 capsule total) by mouth daily.      Cardiovascular: Diuretic Combos Failed - 11/02/2020 12:16 PM      Failed - K in normal range and within 360 days    Potassium  Date Value Ref Range Status  10/18/2019 3.6 3.5 - 5.1 mmol/L Final          Failed - Na in normal range and within 360 days    Sodium  Date Value Ref Range Status  10/18/2019 138 135 - 145 mmol/L Final          Failed - Cr in normal range and within 360 days    Creatinine, Ser  Date Value Ref Range Status  10/18/2019 0.97 0.44 - 1.00 mg/dL Final          Failed - Ca in normal range and within 360 days    Calcium  Date Value Ref Range Status  10/18/2019 8.4 (L) 8.9 - 10.3 mg/dL Final          Passed - Last BP in normal range    BP Readings from Last 1 Encounters:  05/03/20 101/69          Passed - Valid encounter within last 6 months    Recent Outpatient Visits           2 months ago Hyperglycemia   [facility] [place], [place], [place]  6 months ago Well female exam without gynecological exam   [facility], [place], [place]   6 months ago GAD (generalized anxiety disorder)   [facility], [place], [place]       Future Appointments             In 1 month Laural Benes, Binnie Rail, MD [facility]

## 2020-11-02 NOTE — Telephone Encounter (Signed)
Medication Refill - Medication: ibuprofen (ADVIL) 800 MG tablet triamterene-hydrochlorothiazide (DYAZIDE) 37.5-25 MG capsule    Has the patient contacted their pharmacy? No. (Agent: If no, request that the patient contact the pharmacy for the refill.) (Agent: If yes, when and what did the pharmacy advise?)  Preferred Pharmacy (with phone number or street name):  [id number] - [place] - [address] AT [facility] [address]  [address] [place] [address]  Phone: [phone] Fax: [phone]     Agent: Please be advised that RX refills may take up to 3 business days. We ask that you follow-up with your pharmacy.

## 2020-11-03 MED ORDER — IBUPROFEN 800 MG PO TABS: 800.0000 mg | ORAL_TABLET | Freq: Three times a day (TID) | ORAL | 0 refills | Status: DC | PRN

## 2020-12-23 ENCOUNTER — Other Ambulatory Visit: Payer: Self-pay | Admitting: Family Medicine

## 2020-12-23 DIAGNOSIS — N944 Primary dysmenorrhea: Secondary | ICD-10-CM

## 2020-12-31 ENCOUNTER — Ambulatory Visit: Payer: Medicaid Other | Admitting: Internal Medicine

## 2021-01-25 ENCOUNTER — Other Ambulatory Visit: Payer: Self-pay | Admitting: Physician Assistant

## 2021-01-25 ENCOUNTER — Other Ambulatory Visit: Payer: Self-pay | Admitting: Family Medicine

## 2021-01-25 DIAGNOSIS — I10 Essential (primary) hypertension: Secondary | ICD-10-CM

## 2021-01-25 DIAGNOSIS — N944 Primary dysmenorrhea: Secondary | ICD-10-CM

## 2021-02-23 ENCOUNTER — Other Ambulatory Visit: Payer: Self-pay | Admitting: Family Medicine

## 2021-02-23 DIAGNOSIS — I10 Essential (primary) hypertension: Secondary | ICD-10-CM

## 2021-02-23 MED ORDER — CARVEDILOL 12.5 MG PO TABS
12.5000 mg | ORAL_TABLET | Freq: Two times a day (BID) | ORAL | 0 refills | Status: DC
Start: 2021-02-23 — End: 2021-03-10

## 2021-02-23 NOTE — Telephone Encounter (Signed)
Medication Refill - Medication: Carvedilol   Has the patient contacted their pharmacy? Yes.   Pt states that she contacted pharmacy and they sent over request on Friday with no response. Pt states that she is completely out of medication. Pt has an appt coming up on 03/01/21. Please advise.  (Agent: If no, request that the patient contact the pharmacy for the refill.) (Agent: If yes, when and what did the pharmacy advise?)  Preferred Pharmacy (with phone number or street name):  [id number] - [place], [place] - [address] AT [facility] [address]  [address] [place] [address]  Phone: [phone] Fax: [phone]  Hours: Open 24 hours    Agent: Please be advised that RX refills may take up to 3 business days. We ask that you follow-up with your pharmacy.

## 2021-02-23 NOTE — Telephone Encounter (Signed)
  Notes to clinic:  Patient would like a short supply until appt on 03/01/2021 Patient is completely out of medication    Requested Prescriptions  Pending Prescriptions Disp Refills   carvedilol (COREG) 12.5 MG tablet 180 tablet 1    Sig: Take 1 tablet (12.5 mg total) by mouth 2 (two) times daily with a meal.      Cardiovascular:  Beta Blockers Failed - 02/23/2021  1:09 PM      Failed - Valid encounter within last 6 months    Recent Outpatient Visits           6 months ago Hyperglycemia   [facility] [place], [place], [place]   9 months ago Well female exam without gynecological exam   [facility], [place], [place]   10 months ago GAD (generalized anxiety disorder)   [facility], [place], [place]       Future Appointments             In 6 days Marcine Matar, MD [facility]             Passed - Last BP in normal range    BP Readings from Last 1 Encounters:  05/03/20 101/69          Passed - Last Heart Rate in normal range    Pulse Readings from Last 1 Encounters:  05/03/20 81

## 2021-03-01 ENCOUNTER — Ambulatory Visit: Payer: Medicaid Other | Admitting: Internal Medicine

## 2021-03-03 ENCOUNTER — Other Ambulatory Visit: Payer: Self-pay | Admitting: Family Medicine

## 2021-03-03 DIAGNOSIS — I10 Essential (primary) hypertension: Secondary | ICD-10-CM

## 2021-03-03 NOTE — Telephone Encounter (Signed)
   Notes to clinic:  script filled on 02/23/2021 Should have enough medication until appt on 03/10/2021   Requested Prescriptions  Pending Prescriptions Disp Refills   carvedilol (COREG) 12.5 MG tablet [Pharmacy Med Name: CARVEDILOL 12.5 MG TABLET] 60 tablet 0    Sig: Take 1 tablet (12.5 mg total) by mouth 2 (two) times daily with a meal.     Cardiovascular:  Beta Blockers Failed - 03/03/2021  2:40 PM      Failed - Valid encounter within last 6 months    Recent Outpatient Visits           6 months ago Hyperglycemia   [facility] [place], Marzella Schlein, [place]   10 months ago Well female exam without gynecological exam   [facility], [place], [place]   10 months ago GAD (generalized anxiety disorder)   [facility] [place], [place], [place]       Future Appointments             In 1 week Anders Simmonds, PA-C [facility]            Passed - Last BP in normal range    BP Readings from Last 1 Encounters:  05/03/20 101/69          Passed - Last Heart Rate in normal range    Pulse Readings from Last 1 Encounters:  05/03/20 81

## 2021-03-04 ENCOUNTER — Other Ambulatory Visit: Payer: Self-pay

## 2021-03-04 ENCOUNTER — Other Ambulatory Visit (HOSPITAL_COMMUNITY)
Admission: RE | Admit: 2021-03-04 | Discharge: 2021-03-04 | Disposition: A | Discharge status: Home / Self Care | Payer: Medicaid Other | Source: Ambulatory Visit | Attending: Obstetrics and Gynecology | Admitting: Obstetrics and Gynecology

## 2021-03-04 ENCOUNTER — Ambulatory Visit (INDEPENDENT_AMBULATORY_CARE_PROVIDER_SITE_OTHER): Payer: Medicaid Other

## 2021-03-04 ENCOUNTER — Other Ambulatory Visit: Payer: Self-pay | Admitting: Internal Medicine

## 2021-03-04 VITALS — BP 141/92 | HR 72

## 2021-03-04 DIAGNOSIS — N898 Other specified noninflammatory disorders of vagina: Secondary | ICD-10-CM | POA: Insufficient documentation

## 2021-03-04 DIAGNOSIS — N944 Primary dysmenorrhea: Secondary | ICD-10-CM

## 2021-03-04 NOTE — Telephone Encounter (Signed)
  Notes to clinic:  Review for continued use Short supply given    Requested Prescriptions  Pending Prescriptions Disp Refills   ibuprofen (ADVIL) 800 MG tablet [Pharmacy Med Name: IBUPROFEN 800 MG TABLET] 60 tablet 0    Sig: TAKE 1 TABLET BY MOUTH EVERY 8 HOURS AS NEEDED FOR CRAMPING     Analgesics:  NSAIDS Failed - 03/04/2021  8:18 AM      Failed - Cr in normal range and within 360 days    Creatinine, Ser  Date Value Ref Range Status  10/18/2019 0.97 0.44 - 1.00 mg/dL Final          Passed - HGB in normal range and within 360 days    Hemoglobin  Date Value Ref Range Status  05/03/2020 12.9 11.1 - 15.9 g/dL Final          Passed - Patient is not pregnant      Passed - Valid encounter within last 12 months    Recent Outpatient Visits           6 months ago Hyperglycemia   [facility] [place], [place], [place]   10 months ago Well female exam without gynecological exam   [facility], [place], [place]   10 months ago GAD (generalized anxiety disorder)   [facility] [place], [place], [place]       Future Appointments             In 6 days Heber, Marzella Schlein, PA-C [facility]

## 2021-03-04 NOTE — Progress Notes (Signed)
SUBJECTIVE:  30 y.o. female complains of clear vaginal discharge for a couple of days.  Denies abnormal vaginal bleeding or significant pelvic pain or fever. No UTI symptoms. Denies history of known exposure to STD.  No LMP recorded.  OBJECTIVE:  She appears well, afebrile. Urine dipstick: not done.  ASSESSMENT:  Vaginal Discharge: small amount  Vaginal Odor: small amount    PLAN:  GC, chlamydia, trichomonas, BVAG, CVAG probe sent to lab. Treatment: To be determined once lab results are received ROV prn if symptoms persist or worsen.

## 2021-03-07 LAB — CERVICOVAGINAL ANCILLARY ONLY
Bacterial Vaginitis (gardnerella): POSITIVE — AB
Candida Glabrata: NEGATIVE
Candida Vaginitis: NEGATIVE
Chlamydia: NEGATIVE
Comment: NEGATIVE
Comment: NEGATIVE
Comment: NEGATIVE
Comment: NEGATIVE
Comment: NEGATIVE
Comment: NORMAL
Neisseria Gonorrhea: NEGATIVE
Trichomonas: NEGATIVE

## 2021-03-08 ENCOUNTER — Other Ambulatory Visit: Payer: Self-pay

## 2021-03-08 DIAGNOSIS — N760 Acute vaginitis: Secondary | ICD-10-CM

## 2021-03-08 DIAGNOSIS — B9689 Other specified bacterial agents as the cause of diseases classified elsewhere: Secondary | ICD-10-CM

## 2021-03-08 MED ORDER — METRONIDAZOLE 500 MG PO TABS: 500.0000 mg | ORAL_TABLET | Freq: Two times a day (BID) | ORAL | 0 refills | Status: DC

## 2021-03-08 NOTE — Progress Notes (Signed)
Rx sent as advised for BV to pt pharmacy on file Mychart message sent to pt  Pt is active on Mychart.

## 2021-03-10 ENCOUNTER — Ambulatory Visit: Payer: Medicaid Other | Attending: Internal Medicine | Admitting: Physician Assistant

## 2021-03-10 ENCOUNTER — Encounter: Payer: Self-pay | Admitting: Physician Assistant

## 2021-03-10 ENCOUNTER — Telehealth: Payer: Self-pay | Admitting: Physician Assistant

## 2021-03-10 ENCOUNTER — Other Ambulatory Visit: Payer: Self-pay

## 2021-03-10 VITALS — BP 132/93 | HR 60 | Ht 65.0 in | Wt 269.0 lb

## 2021-03-10 DIAGNOSIS — R7303 Prediabetes: Secondary | ICD-10-CM | POA: Insufficient documentation

## 2021-03-10 DIAGNOSIS — Z713 Dietary counseling and surveillance: Secondary | ICD-10-CM | POA: Insufficient documentation

## 2021-03-10 DIAGNOSIS — A6000 Herpesviral infection of urogenital system, unspecified: Secondary | ICD-10-CM | POA: Diagnosis not present

## 2021-03-10 DIAGNOSIS — Z7901 Long term (current) use of anticoagulants: Secondary | ICD-10-CM | POA: Diagnosis not present

## 2021-03-10 DIAGNOSIS — Z79899 Other long term (current) drug therapy: Secondary | ICD-10-CM | POA: Diagnosis not present

## 2021-03-10 DIAGNOSIS — Z7182 Exercise counseling: Secondary | ICD-10-CM | POA: Diagnosis not present

## 2021-03-10 DIAGNOSIS — I10 Essential (primary) hypertension: Secondary | ICD-10-CM | POA: Diagnosis not present

## 2021-03-10 DIAGNOSIS — R946 Abnormal results of thyroid function studies: Secondary | ICD-10-CM | POA: Diagnosis not present

## 2021-03-10 DIAGNOSIS — R7989 Other specified abnormal findings of blood chemistry: Secondary | ICD-10-CM | POA: Insufficient documentation

## 2021-03-10 DIAGNOSIS — F411 Generalized anxiety disorder: Secondary | ICD-10-CM | POA: Diagnosis not present

## 2021-03-10 DIAGNOSIS — F439 Reaction to severe stress, unspecified: Secondary | ICD-10-CM | POA: Diagnosis not present

## 2021-03-10 DIAGNOSIS — A6009 Herpesviral infection of other urogenital tract: Secondary | ICD-10-CM | POA: Diagnosis not present

## 2021-03-10 DIAGNOSIS — R739 Hyperglycemia, unspecified: Secondary | ICD-10-CM

## 2021-03-10 LAB — POCT GLYCOSYLATED HEMOGLOBIN (HGB A1C): HbA1c, POC (controlled diabetic range): 6.1 % (ref 0.0–7.0)

## 2021-03-10 LAB — GLUCOSE, POCT (MANUAL RESULT ENTRY): POC Glucose: 105 mg/dl — AB (ref 70–99)

## 2021-03-10 MED ORDER — CARVEDILOL 12.5 MG PO TABS: 12.5000 mg | ORAL_TABLET | Freq: Two times a day (BID) | ORAL | 1 refills | Status: DC

## 2021-03-10 MED ORDER — VALACYCLOVIR HCL 1 G PO TABS: 1000.0000 mg | ORAL_TABLET | Freq: Two times a day (BID) | ORAL | 5 refills | Status: DC

## 2021-03-10 MED ORDER — TRIAMTERENE-HCTZ 37.5-25 MG PO CAPS: 1.0000 | ORAL_CAPSULE | Freq: Every day | ORAL | 1 refills | Status: DC

## 2021-03-10 MED ORDER — BUSPIRONE HCL 10 MG PO TABS: 10.0000 mg | ORAL_TABLET | Freq: Two times a day (BID) | ORAL | 3 refills | Status: DC

## 2021-03-10 NOTE — Progress Notes (Signed)
Patient ID: Gloria Chase, female   DOB: [birth year], 30 y.o.   MRN: [id number]   Gloria Chase, is a 30 y.o. female  HDQ:[id number]  [id number]:[id number]  DOB - 04/22/1991  Chief Complaint  Patient presents with   Hypertension       Subjective:   Gloria Chase is a 30 y.o. female here today for med RF.  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  She has a Geophysicist/field seismologist.  She feels a lot of stress and wants to try an anxiety med.  She denies SI/HI but admits to feeling overwhelmed.  She has previously done counseling and is open and hoping to restart counseling.  She has a 70 and 32 yr old and helps take care of her mom.  She is tearful at times in the visit.  Depression screen [facility] 2/9 03/10/2021 05/12/2020 04/09/2020  Decreased Interest 3 1 0  Down, Depressed, Hopeless 1 3 0  PHQ - 2 Score 4 4 0  Altered sleeping 1 3 0  Tired, decreased energy 1 3 0  Change in appetite 0 2 0  Feeling bad or failure about yourself  0 3 0  Trouble concentrating 0 3 0  Moving slowly or fidgety/restless 0 0 0  Suicidal thoughts 0 1 0  PHQ-9 Score 6 19 0  Difficult doing work/chores Not difficult at all Extremely dIfficult -   GAD 7 : Generalized Anxiety Score 03/10/2021 05/12/2020 04/09/2020 06/01/2017  Nervous, Anxious, on Edge 3 3 0 3  Control/stop worrying 3 3 0 3  Worry too much - different things 3 3 0 3  Trouble relaxing 3 3 0 3  Restless 1 1 0 1  Easily annoyed or irritable 2 3 0 3  Afraid - awful might happen 3 1 0 1  Total GAD 7 Score 18 17 0 17  Anxiety Difficulty Not difficult at all Extremely difficult - -       No problems updated.  ALLERGIES: No Known Allergies  PAST MEDICAL HISTORY: Past Medical History:  Diagnosis Date   Anemia    History of colposcopy with cervical biopsy 01/11/2011   History of trichomoniasis    History of wisdom tooth extraction 2005   Hx of hernia repair 2000   Hypertension    Morbid obesity  (HCC)     MEDICATIONS AT HOME: Prior to Admission medications   Medication Sig Start Date End Date Taking? Authorizing Provider  busPIRone (BUSPAR) 10 MG tablet Take 1 tablet (10 mg total) by mouth 2 (two) times daily. 03/10/21  Yes Georgian Co M, PA-C  carvedilol (COREG) 12.5 MG tablet Take 1 tablet (12.5 mg total) by mouth 2 (two) times daily with a meal. 03/10/21   Railyn House, Marzella Schlein, PA-C  ibuprofen (ADVIL) 800 MG tablet TAKE 1 TABLET BY MOUTH EVERY 8 HOURS AS NEEDED FOR CRAMPING 01/26/21   Marcine Matar, MD  Multiple Vitamin (MULTIVITAMIN) capsule Take 1 capsule daily by mouth. Patient not taking: Reported on 04/09/2020    [provider]  Prenatal 27-1 MG TABS TAKE 1 TABLET BY MOUTH EVERY DAY BEFORE BREAKFAST 04/11/20   Brock Bad, MD  triamterene-hydrochlorothiazide (DYAZIDE) 37.5-25 MG capsule Take 1 each (1 capsule total) by mouth daily. 03/10/21   Anders Simmonds, PA-C  valACYclovir (VALTREX) 1000 MG tablet Take 1 tablet (1,000 mg total) by mouth 2 (two) times daily. Take for 3 days, with each outbreak. 03/10/21  Elby Blackwelder M, PA-C    ROS: Neg HEENT Neg resp Neg cardiac Neg GI Neg GU Neg MS Neg psych other than above Neg neuro  Objective:   Vitals:   03/10/21 0936  BP: (!) 132/93  Pulse: 60  SpO2: 98%  Weight: 269 lb 0.2 oz (122 kg)  Height: 5\' 5"  (1.651 m)   Exam General appearance : Awake, alert, not in any distress. Speech Clear. Not toxic looking HEENT: Atraumatic and Normocephalic,Neck: Supple, no JVD. No cervical lymphadenopathy.  Chest: Good air entry bilaterally, CTAB.  No rales/rhonchi/wheezing CVS: S1 S2 regular, no murmurs.  Extremities: B/L Lower Ext shows no edema, both legs are warm to touch Neurology: Awake alert, and oriented X 3, CN II-XII intact, Non focal Skin: No Rash Psych-TP linear.  J/I good.  Speech clear. Affect alternates appropriately bt tearful and hopeful  Data Review Lab Results  Component Value Date    HGBA1C 6.1 03/10/2021   HGBA1C 6.2 (H) 05/03/2020    Assessment & Plan   1. Prediabetes A1C 6.1 today(6.2 previously) I have had a lengthy discussion and provided education about insulin resistance and the intake of too much sugar/refined carbohydrates.  I have advised the patient to work at a goal of eliminating sugary drinks, candy, desserts, sweets, refined sugars, processed foods, and white carbohydrates.  The patient expresses understanding.  - Glucose (CBG) - HgB A1c - Comprehensive metabolic panel  2. HTN (hypertension), benign Just resumed meds a day or 2 ago.  She is going to get a BP cuff and work on lifestyle changes - carvedilol (COREG) 12.5 MG tablet; Take 1 tablet (12.5 mg total) by mouth 2 (two) times daily with a meal.  Dispense: 180 tablet; Refill: 1 - triamterene-hydrochlorothiazide (DYAZIDE) 37.5-25 MG capsule; Take 1 each (1 capsule total) by mouth daily.  Dispense: 90 capsule; Refill: 1 - Comprehensive metabolic panel - Lipid panel - CBC with Differential/Platelet  3. Genital herpes simplex, unspecified site - valACYclovir (VALTREX) 1000 MG tablet; Take 1 tablet (1,000 mg total) by mouth 2 (two) times daily. Take for 3 days, with each outbreak.  Dispense: 30 tablet; Refill: 5  4. Abnormal TSH - Comprehensive metabolic panel - Thyroid Panel With TSH  5. GAD (generalized anxiety disorder) - busPIRone (BUSPAR) 10 MG tablet; Take 1 tablet (10 mg total) by mouth 2 (two) times daily.  Dispense: 60 tablet; Refill: 3 Counseled on self care-proper diet and exercise I will have our LCSW reach out for resources  6. Stress - busPIRone (BUSPAR) 10 MG tablet; Take 1 tablet (10 mg total) by mouth 2 (two) times daily.  Dispense: 60 tablet; Refill: 3    Patient have been counseled extensively about nutrition and exercise. Other issues discussed during this visit include: low cholesterol diet, weight control and daily exercise, foot care, annual eye examinations at  Ophthalmology, importance of adherence with medications and regular follow-up. We also discussed long term complications of uncontrolled diabetes and hypertension.   Return for PLEASE ASSIGN NEW PCP!! previously [facility]-4-5 months.  The patient was given clear instructions to go to ER or return to medical center if symptoms don't improve, worsen or new problems develop. The patient verbalized understanding. The patient was told to call to get lab results if they haven't heard anything in the next week.      04-22-1994, PA-C [facility], [place]   03/10/2021, 10:01 AM

## 2021-03-11 LAB — THYROID PANEL WITH TSH
Free Thyroxine Index: 2.1 (ref 1.2–4.9)
T3 Uptake Ratio: 29 % (ref 24–39)
T4, Total: 7.4 ug/dL (ref 4.5–12.0)
TSH: 0.526 u[IU]/mL (ref 0.450–4.500)

## 2021-03-11 LAB — COMPREHENSIVE METABOLIC PANEL
ALT: 5 IU/L (ref 0–32)
AST: 14 IU/L (ref 0–40)
Albumin/Globulin Ratio: 1.3 (ref 1.2–2.2)
Albumin: 3.5 g/dL — ABNORMAL LOW (ref 3.8–4.8)
Alkaline Phosphatase: 68 IU/L (ref 44–121)
BUN/Creatinine Ratio: 17 (ref 9–23)
BUN: 13 mg/dL (ref 6–20)
Bilirubin Total: <0.2 mg/dL (ref 0.0–1.2)
CO2: 24 mmol/L (ref 20–29)
Calcium: 8.6 mg/dL — ABNORMAL LOW (ref 8.7–10.2)
Chloride: 106 mmol/L (ref 96–106)
Creatinine, Ser: 0.76 mg/dL (ref 0.57–1.00)
Globulin, Total: 2.8 g/dL (ref 1.5–4.5)
Glucose: 94 mg/dL (ref 65–99)
Potassium: 4.4 mmol/L (ref 3.5–5.2)
Sodium: 140 mmol/L (ref 134–144)
Total Protein: 6.3 g/dL (ref 6.0–8.5)
eGFR: 107 mL/min/{1.73_m2} (ref >59)

## 2021-03-11 LAB — LIPID PANEL
Chol/HDL Ratio: 2.7 ratio (ref 0.0–4.4)
Cholesterol, Total: 107 mg/dL (ref 100–199)
HDL: 39 mg/dL — ABNORMAL LOW (ref >39)
LDL Chol Calc (NIH): 54 mg/dL (ref 0–99)
Triglycerides: 63 mg/dL (ref 0–149)
VLDL Cholesterol Cal: 14 mg/dL (ref 5–40)

## 2021-03-11 LAB — CBC WITH DIFFERENTIAL/PLATELET
Basophils Absolute: 0.1 10*3/uL (ref 0.0–0.2)
Basos: 1 %
EOS (ABSOLUTE): 0.1 10*3/uL (ref 0.0–0.4)
Eos: 2 %
Hematocrit: 41.9 % (ref 34.0–46.6)
Hemoglobin: 13.9 g/dL (ref 11.1–15.9)
Immature Grans (Abs): 0 10*3/uL (ref 0.0–0.1)
Immature Granulocytes: 0 %
Lymphocytes Absolute: 1.4 10*3/uL (ref 0.7–3.1)
Lymphs: 26 %
MCH: 31.4 pg (ref 26.6–33.0)
MCHC: 33.2 g/dL (ref 31.5–35.7)
MCV: 95 fL (ref 79–97)
Monocytes Absolute: 0.4 10*3/uL (ref 0.1–0.9)
Monocytes: 7 %
Neutrophils Absolute: 3.4 10*3/uL (ref 1.4–7.0)
Neutrophils: 64 %
Platelets: 183 10*3/uL (ref 150–450)
RBC: 4.43 x10E6/uL (ref 3.77–5.28)
RDW: 11.4 % — ABNORMAL LOW (ref 11.7–15.4)
WBC: 5.4 10*3/uL (ref 3.4–10.8)

## 2021-03-11 NOTE — Telephone Encounter (Signed)
Spoke with pt and scheduled virtual visit for 9.19.22

## 2021-04-04 ENCOUNTER — Encounter: Payer: Medicaid Other | Admitting: Clinical

## 2021-04-13 ENCOUNTER — Telehealth: Payer: Self-pay

## 2021-04-13 NOTE — Telephone Encounter (Signed)
Copied from CRM [id number]. Topic: General - Other >> Apr 13, 2021  1:11 PM Gwenlyn Fudge wrote: Reason for CRM: Pt called and is requesting to have a note that states that she is unable to take the covid vaccine and is requesting to have a note for work stating this. Please advise.   Patient last appointment was with Marylene Land on 8/25

## 2021-04-13 NOTE — Telephone Encounter (Signed)
Pt was called and informed that we will not be able to provide her with a letter at this time.

## 2021-06-17 ENCOUNTER — Ambulatory Visit: Payer: Medicaid Other | Admitting: Obstetrics

## 2021-06-23 ENCOUNTER — Other Ambulatory Visit: Payer: Self-pay

## 2021-06-23 ENCOUNTER — Other Ambulatory Visit (HOSPITAL_COMMUNITY)
Admission: RE | Admit: 2021-06-23 | Discharge: 2021-06-23 | Disposition: A | Discharge status: Home / Self Care | Payer: Medicaid Other | Source: Ambulatory Visit | Attending: Obstetrics | Admitting: Obstetrics

## 2021-06-23 ENCOUNTER — Encounter: Payer: Self-pay | Admitting: *Deleted

## 2021-06-23 ENCOUNTER — Ambulatory Visit (INDEPENDENT_AMBULATORY_CARE_PROVIDER_SITE_OTHER): Payer: Medicaid Other | Admitting: *Deleted

## 2021-06-23 VITALS — BP 115/75 | HR 77

## 2021-06-23 DIAGNOSIS — N760 Acute vaginitis: Secondary | ICD-10-CM | POA: Diagnosis not present

## 2021-06-23 DIAGNOSIS — B9689 Other specified bacterial agents as the cause of diseases classified elsewhere: Secondary | ICD-10-CM | POA: Diagnosis not present

## 2021-06-23 NOTE — Progress Notes (Addendum)
SUBJECTIVE:  30 y.o. female complains of clear vaginal discharge for 3-4 week(s). Reports vaginal odor and recurrent BV. Denies abnormal vaginal bleeding or significant pelvic pain or fever. No UTI symptoms. Denies history of known exposure to STD.  No LMP recorded.  OBJECTIVE:  She appears well, afebrile. Urine dipstick: not done.  ASSESSMENT:  Vaginal Discharge  Vaginal Odor   PLAN:  BVAG, CVAG probe sent to lab. Treatment: To be determined once lab results are received ROV prn if symptoms persist or worsen.   Patient was assessed and managed by nursing staff during this encounter. I have reviewed the chart and agree with the documentation and plan. I have also made any necessary editorial changes.  Coral Ceo, MD 06/23/2021 1:09 PM

## 2021-06-24 LAB — CERVICOVAGINAL ANCILLARY ONLY
Bacterial Vaginitis (gardnerella): POSITIVE — AB
Candida Glabrata: NEGATIVE
Candida Vaginitis: POSITIVE — AB
Comment: NEGATIVE
Comment: NEGATIVE
Comment: NEGATIVE

## 2021-06-26 ENCOUNTER — Other Ambulatory Visit: Payer: Self-pay | Admitting: Obstetrics

## 2021-06-26 DIAGNOSIS — B9689 Other specified bacterial agents as the cause of diseases classified elsewhere: Secondary | ICD-10-CM

## 2021-06-26 DIAGNOSIS — N760 Acute vaginitis: Secondary | ICD-10-CM

## 2021-06-26 DIAGNOSIS — B379 Candidiasis, unspecified: Secondary | ICD-10-CM

## 2021-06-26 MED ORDER — FLUCONAZOLE 150 MG PO TABS: 150.0000 mg | ORAL_TABLET | Freq: Once | ORAL | 0 refills | Status: AC

## 2021-06-26 MED ORDER — METRONIDAZOLE 500 MG PO TABS: 500.0000 mg | ORAL_TABLET | Freq: Two times a day (BID) | ORAL | 2 refills | Status: DC

## 2021-08-18 ENCOUNTER — Other Ambulatory Visit: Payer: Self-pay | Admitting: Physician Assistant

## 2021-08-18 DIAGNOSIS — I10 Essential (primary) hypertension: Secondary | ICD-10-CM

## 2021-08-19 ENCOUNTER — Other Ambulatory Visit: Payer: Self-pay | Admitting: Physician Assistant

## 2021-08-19 DIAGNOSIS — I10 Essential (primary) hypertension: Secondary | ICD-10-CM

## 2021-08-19 NOTE — Telephone Encounter (Signed)
Requested Prescriptions  Pending Prescriptions Disp Refills   carvedilol (COREG) 12.5 MG tablet [Pharmacy Med Name: CARVEDILOL 12.5 MG TABLET] 180 tablet 1    Sig: TAKE 1 TABLET BY MOUTH TWICE A DAY WITH A MEAL     Cardiovascular: Beta Blockers 3 Passed - 08/19/2021  4:45 PM      Passed - Cr in normal range and within 360 days    Creatinine, Ser  Date Value Ref Range Status  03/10/2021 0.76 0.57 - 1.00 mg/dL Final         Passed - AST in normal range and within 360 days    AST  Date Value Ref Range Status  03/10/2021 14 0 - 40 IU/L Final         Passed - ALT in normal range and within 360 days    ALT  Date Value Ref Range Status  03/10/2021 5 0 - 32 IU/L Final         Passed - Last BP in normal range    BP Readings from Last 1 Encounters:  06/23/21 115/75         Passed - Last Heart Rate in normal range    Pulse Readings from Last 1 Encounters:  06/23/21 77         Passed - Valid encounter within last 6 months    Recent Outpatient Visits          5 months ago Prediabetes   [facility] [place], [place], [place]   1 year ago Hyperglycemia   [facility] [place], Marzella Schlein, [place]   1 year ago Well female exam without gynecological exam   [facility] [place], [place], [place]   1 year ago GAD (generalized anxiety disorder)   [facility] Cain Saupe, [place]

## 2021-11-18 ENCOUNTER — Other Ambulatory Visit: Payer: Self-pay | Admitting: Physician Assistant

## 2021-11-18 DIAGNOSIS — I10 Essential (primary) hypertension: Secondary | ICD-10-CM

## 2021-11-18 NOTE — Telephone Encounter (Signed)
Requested Prescriptions  ?Pending Prescriptions Disp Refills  ?? carvedilol (COREG) 12.5 MG tablet [Pharmacy Med Name: CARVEDILOL 12.5 MG TABLET] 180 tablet 0  ?  Sig: TAKE 1 TABLET BY MOUTH TWICE A DAY WITH MEALS  ?  ? Cardiovascular: Beta Blockers 3 Failed - 11/18/2021  9:28 AM  ?  ?  Failed - Valid encounter within last 6 months  ?  Recent Outpatient Visits   ?      ? 8 months ago Prediabetes  ? [facility] [place], [place], [place]  ? 1 year ago Hyperglycemia  ? [facility] [place], Marzella Schlein, [place]  ? 1 year ago Well female exam without gynecological exam  ? [facility], [place], [place]  ? 1 year ago GAD (generalized anxiety disorder)  ? [facility], [place], [place]  ?  ?  ? ?  ?  ?  Passed - Cr in normal range and within 360 days  ?  Creatinine, Ser  ?Date Value Ref Range Status  ?03/10/2021 0.76 0.57 - 1.00 mg/dL Final  ?   ?  ?  Passed - AST in normal range and within 360 days  ?  AST  ?Date Value Ref Range Status  ?03/10/2021 14 0 - 40 IU/L Final  ?   ?  ?  Passed - ALT in normal range and within 360 days  ?  ALT  ?Date Value Ref Range Status  ?03/10/2021 5 0 - 32 IU/L Final  ?   ?  ?  Passed - Last BP in normal range  ?  BP Readings from Last 1 Encounters:  ?06/23/21 115/75  ?   ?  ?  Passed - Last Heart Rate in normal range  ?  Pulse Readings from Last 1 Encounters:  ?06/23/21 77  ?   ?  ?  ? ? ?

## 2022-02-20 ENCOUNTER — Other Ambulatory Visit: Payer: Self-pay | Admitting: Physician Assistant

## 2022-02-20 DIAGNOSIS — I10 Essential (primary) hypertension: Secondary | ICD-10-CM

## 2022-02-21 NOTE — Telephone Encounter (Signed)
Requested Prescriptions  Pending Prescriptions Disp Refills  . carvedilol (COREG) 12.5 MG tablet [Pharmacy Med Name: CARVEDILOL 12.5 MG TABLET] 46 tablet 0    Sig: TAKE 1 TABLET BY MOUTH TWICE A DAY WITH MEALS     Cardiovascular: Beta Blockers 3 Failed - 02/20/2022  9:55 AM      Failed - Valid encounter within last 6 months    Recent Outpatient Visits          11 months ago Prediabetes   [facility] [place], [place], [place]   1 year ago Hyperglycemia   [facility] [place], [place], [place]   1 year ago Well female exam without gynecological exam   [facility], [place], [place]   1 year ago GAD (generalized anxiety disorder)   [facility] [place], [place], [place]      Future Appointments            In 3 weeks Camilla, Marzella Schlein, PA-C [facility]           Passed - Cr in normal range and within 360 days    Creatinine, Ser  Date Value Ref Range Status  03/10/2021 0.76 0.57 - 1.00 mg/dL Final         Passed - AST in normal range and within 360 days    AST  Date Value Ref Range Status  03/10/2021 14 0 - 40 IU/L Final         Passed - ALT in normal range and within 360 days    ALT  Date Value Ref Range Status  03/10/2021 5 0 - 32 IU/L Final         Passed - Last BP in normal range    BP Readings from Last 1 Encounters:  06/23/21 115/75         Passed - Last Heart Rate in normal range    Pulse Readings from Last 1 Encounters:  06/23/21 77

## 2022-02-22 ENCOUNTER — Encounter (INDEPENDENT_AMBULATORY_CARE_PROVIDER_SITE_OTHER): Payer: Self-pay

## 2022-03-15 ENCOUNTER — Encounter: Payer: Self-pay | Admitting: Physician Assistant

## 2022-03-15 ENCOUNTER — Telehealth: Payer: Self-pay | Admitting: Physician Assistant

## 2022-03-15 ENCOUNTER — Ambulatory Visit: Payer: Medicaid Other | Attending: Physician Assistant | Admitting: Physician Assistant

## 2022-03-15 VITALS — BP 140/100 | HR 58 | Ht 65.5 in | Wt 243.2 lb

## 2022-03-15 DIAGNOSIS — I10 Essential (primary) hypertension: Secondary | ICD-10-CM | POA: Diagnosis not present

## 2022-03-15 DIAGNOSIS — Z862 Personal history of diseases of the blood and blood-forming organs and certain disorders involving the immune mechanism: Secondary | ICD-10-CM

## 2022-03-15 DIAGNOSIS — R7989 Other specified abnormal findings of blood chemistry: Secondary | ICD-10-CM

## 2022-03-15 DIAGNOSIS — Z1331 Encounter for screening for depression: Secondary | ICD-10-CM | POA: Diagnosis not present

## 2022-03-15 DIAGNOSIS — R7303 Prediabetes: Secondary | ICD-10-CM | POA: Diagnosis not present

## 2022-03-15 DIAGNOSIS — F411 Generalized anxiety disorder: Secondary | ICD-10-CM

## 2022-03-15 DIAGNOSIS — F439 Reaction to severe stress, unspecified: Secondary | ICD-10-CM

## 2022-03-15 DIAGNOSIS — Z789 Other specified health status: Secondary | ICD-10-CM

## 2022-03-15 DIAGNOSIS — A6000 Herpesviral infection of urogenital system, unspecified: Secondary | ICD-10-CM | POA: Diagnosis not present

## 2022-03-15 MED ORDER — VALACYCLOVIR HCL 1 G PO TABS: 1000.0000 mg | ORAL_TABLET | Freq: Two times a day (BID) | ORAL | 5 refills | Status: DC

## 2022-03-15 MED ORDER — TRIAMTERENE-HCTZ 37.5-25 MG PO CAPS: 1.0000 | ORAL_CAPSULE | Freq: Every day | ORAL | 1 refills | Status: DC

## 2022-03-15 MED ORDER — CITALOPRAM HYDROBROMIDE 10 MG PO TABS: 10.0000 mg | ORAL_TABLET | Freq: Every day | ORAL | 3 refills | Status: DC

## 2022-03-15 MED ORDER — BUSPIRONE HCL 10 MG PO TABS: 10.0000 mg | ORAL_TABLET | Freq: Two times a day (BID) | ORAL | 3 refills | Status: DC

## 2022-03-15 MED ORDER — CARVEDILOL 12.5 MG PO TABS: 12.5000 mg | ORAL_TABLET | Freq: Two times a day (BID) | ORAL | 1 refills | Status: DC

## 2022-03-15 NOTE — Progress Notes (Signed)
Patient ID: Gloria Chase, female   DOB: 1991-04-13, 31 y.o.   MRN: [id number]   Gloria Chase, is a 31 y.o. female  IOX:[id number]  QAS:[id number]  DOB - 1990-11-09  Chief Complaint  Patient presents with   Medication Refill       Subjective:   Gloria Chase is a 31 y.o. female here today for med RF.    She is not taking the second dose of carvedilol.  Otherwise compliant with triamterene and one dose carvedilol.    She has been out of buspar for about 2 months and definitely notices she is more anxious and has more ups and downs and cries more.  She was on celexa a while ago and it seemed to help with depression while buspar helped with anxiety but she wishes she didn't need to take either.  She had a counselor that she really liked named christian that she really liked but stopped following up.  She is raising her kids on her own.  They have been close to homeless.  She has no car and had to uber to her appt.  Her mom is not supportive and they don't have a close relationship.  She does not invest in many friendships and admits to poor overall self-care.  She tries to be optimistic.   She is open to counseling and resuming medication.  She denies SI/HI.    Wants to do labs and get UTD on health issues  No problems updated.  ALLERGIES: No Known Allergies  PAST MEDICAL HISTORY: Past Medical History:  Diagnosis Date   Anemia    History of colposcopy with cervical biopsy 01/11/2011   History of trichomoniasis    History of wisdom tooth extraction 2005   Hx of hernia repair 2000   Hypertension    Morbid obesity (HCC)     MEDICATIONS AT HOME: Prior to Admission medications   Medication Sig Start Date End Date Taking? Authorizing Provider  citalopram (CELEXA) 10 MG tablet Take 1 tablet (10 mg total) by mouth daily. 03/15/22  Yes Diamonds Lippard, Marzella Schlein, PA-C  busPIRone (BUSPAR) 10 MG tablet Take 1 tablet (10 mg total) by mouth 2 (two) times daily. 03/15/22   Anders Simmonds, PA-C  carvedilol (COREG) 12.5 MG tablet Take 1 tablet (12.5 mg total) by mouth 2 (two) times daily with a meal. 03/15/22   Juvencio Verdi, Marzella Schlein, PA-C  ibuprofen (ADVIL) 800 MG tablet TAKE 1 TABLET BY MOUTH EVERY 8 HOURS AS NEEDED FOR CRAMPING 01/26/21   Marcine Matar, MD  metroNIDAZOLE (FLAGYL) 500 MG tablet Take 1 tablet (500 mg total) by mouth 2 (two) times daily. 06/26/21   Brock Bad, MD  Multiple Vitamin (MULTIVITAMIN) capsule Take 1 capsule daily by mouth. Patient not taking: Reported on 04/09/2020    [provider]  Prenatal 27-1 MG TABS TAKE 1 TABLET BY MOUTH EVERY DAY BEFORE BREAKFAST 04/11/20   Brock Bad, MD  triamterene-hydrochlorothiazide (DYAZIDE) 37.5-25 MG capsule Take 1 each (1 capsule total) by mouth daily. 03/15/22   Anders Simmonds, PA-C  valACYclovir (VALTREX) 1000 MG tablet Take 1 tablet (1,000 mg total) by mouth 2 (two) times daily. Take for 3 days, with each outbreak. 03/15/22   Anders Simmonds, PA-C    ROS: Neg HEENT Neg resp Neg cardiac Neg GI Neg GU Neg MS Neg neuro  Objective:   Vitals:   03/15/22 0858  BP: (!) 140/103  Pulse: (!) 58  SpO2: 99%  Weight: 243  lb 3.2 oz (110.3 kg)  Height: 5' 5.5" (1.664 m)   Exam General appearance : Awake, alert, not in any distress. Speech Clear. Not toxic looking HEENT: Atraumatic and Normocephalic Neck: Supple, no JVD. No cervical lymphadenopathy.  Chest: Good air entry bilaterally, CTAB.  No rales/rhonchi/wheezing CVS: S1 S2 regular, no murmurs.  Extremities: B/L Lower Ext shows no edema, both legs are warm to touch Neurology: Awake alert, and oriented X 3, CN II-XII intact, Non focal Skin: No Rash Psych: affect is anxious, tearful at times.  TP linear.  J/I fair  Data Review Lab Results  Component Value Date   HGBA1C 6.1 03/10/2021   HGBA1C 6.2 (H) 05/03/2020    Assessment & Plan   1. Prediabetes I have had a lengthy discussion and provided education about insulin resistance  and the intake of too much sugar/refined carbohydrates.  I have advised the patient to work at a goal of eliminating sugary drinks, candy, desserts, sweets, refined sugars, processed foods, and white carbohydrates.  The patient expresses understanding.  - Hemoglobin A1c - Lipid panel  2. HTN (hypertension), benign Uncontrolled.  Take meds as directed and include second dose triamterene.  Check blood pressure at least 3 times weekly and record.  Sit still and quiet with deep breathing for at least 5 minutes before checking your pressure.  - Comprehensive metabolic panel - carvedilol (COREG) 12.5 MG tablet; Take 1 tablet (12.5 mg total) by mouth 2 (two) times daily with a meal.  Dispense: 180 tablet; Refill: 1 - triamterene-hydrochlorothiazide (DYAZIDE) 37.5-25 MG capsule; Take 1 each (1 capsule total) by mouth daily.  Dispense: 90 capsule; Refill: 1  3. Abnormal TSH - Thyroid Panel With TSH  4. History of anemia - CBC with Differential/Platelet  5. GAD (generalized anxiety disorder) Will have Reginia Naas LCSW follow up with patient.  Advised patient to follow up with counselor "Ephriam Knuckles" to schedule a virtual visit and continue counseling.  Resume buspar then if only minimally improving; start celexa - citalopram (CELEXA) 10 MG tablet; Take 1 tablet (10 mg total) by mouth daily.  Dispense: 30 tablet; Refill: 3 - busPIRone (BUSPAR) 10 MG tablet; Take 1 tablet (10 mg total) by mouth 2 (two) times daily.  Dispense: 60 tablet; Refill: 3  6. Stress See #5 - citalopram (CELEXA) 10 MG tablet; Take 1 tablet (10 mg total) by mouth daily.  Dispense: 30 tablet; Refill: 3 - busPIRone (BUSPAR) 10 MG tablet; Take 1 tablet (10 mg total) by mouth 2 (two) times daily.  Dispense: 60 tablet; Refill: 3  7. Genital herpes simplex, unspecified site - valACYclovir (VALTREX) 1000 MG tablet; Take 1 tablet (1,000 mg total) by mouth 2 (two) times daily. Take for 3 days, with each outbreak.  Dispense: 30 tablet;  Refill: 5  8. Need for follow-up by social worker See #5 - citalopram (CELEXA) 10 MG tablet; Take 1 tablet (10 mg total) by mouth daily.  Dispense: 30 tablet; Refill: 3 - busPIRone (BUSPAR) 10 MG tablet; Take 1 tablet (10 mg total) by mouth 2 (two) times daily.  Dispense: 60 tablet; Refill: 3  9. Positive depression screening See #5  Spent >45 mins face to face discussing health, depression/anxiety/self-care/deep breathing, etc  Return for 1 month with Franky Macho for BP(virtual visit): 6 months PCP.  The patient was given clear instructions to go to ER or return to medical center if symptoms don't improve, worsen or new problems develop. The patient verbalized understanding. The patient was told to call to get  lab results if they haven't heard anything in the next week.      Freeman Caldron, PA-C [facility] and [facility] [place], [place]   03/15/2022, 9:29 AM

## 2022-03-15 NOTE — Patient Instructions (Addendum)
Check blood pressure at least 3 times weekly and record.  Sit still and quiet with deep breathing for at least 5 minutes before checking your pressure.    Stress, Adult Stress is a normal reaction to life events. Stress is what you feel when life demands more than you are used to, or more than you think you can handle. Some stress can be useful, such as studying for a test or meeting a deadline at work. Stress that occurs too often or for too long can cause problems. Long-lasting stress is called chronic stress. Chronic stress can affect your emotional health and interfere with relationships and normal daily activities. Too much stress can weaken your body's defense system (immune system) and increase your risk for physical illness. If you already have a medical problem, stress can make it worse. What are the causes? All sorts of life events can cause stress. An event that causes stress for one person may not be stressful for someone else. Major life events, whether positive or negative, commonly cause stress. Examples include: Losing a job or starting a new job. Losing a loved one. Moving to a new town or home. Getting married or divorced. Having a baby. Getting injured or sick. Less obvious life events can also cause stress, especially if they occur day after day or in combination with each other. Examples include: Working long hours. Driving in traffic. Caring for children. Being in debt. Being in a difficult relationship. What are the signs or symptoms? Stress can cause emotional and physical symptoms and can lead to unhealthy behaviors. These include the following: Emotional symptoms Anxiety. This is feeling worried, afraid, on edge, overwhelmed, or out of control. Anger, including irritation or impatience. Depression. This is feeling sad, down, helpless, or guilty. Trouble focusing, remembering, or making decisions. Physical symptoms Aches and pains. These may affect your head,  neck, back, stomach, or other areas of your body. Tight muscles or a clenched jaw. Low energy. Trouble sleeping. Unhealthy behaviors Eating to feel better (overeating) or skipping meals. Working too much or putting off tasks. Smoking, drinking alcohol, or using drugs to feel better. How is this diagnosed? A stress disorder is diagnosed through an assessment by your health care provider. A stress disorder may be diagnosed based on: Your symptoms and any stressful life events. Your medical history. Tests to rule out other causes of your symptoms. Depending on your condition, your health care provider may refer you to a specialist for further evaluation. How is this treated?  Stress management techniques are the recommended treatment for stress. Medicine is not typically recommended for treating stress. Techniques to reduce your reaction to stressful life events include: Identifying stress. Monitor yourself for symptoms of stress and notice what causes stress for you. These skills may help you to avoid or prepare for stressful events. Managing time. Set your priorities, keep a calendar of events, and learn to say no. These actions can help you avoid taking on too much. Techniques for dealing with stress include: Rethinking the problem. Try to think realistically about stressful events rather than ignoring them or overreacting. Try to find the positives in a stressful situation rather than focusing on the negatives. Exercise. Physical exercise can release both physical and emotional tension. The key is to find a form of exercise that you enjoy and do it regularly. Relaxation techniques. These relax the body and mind. Find one or more that you enjoy and use the techniques regularly. Examples include: Meditation, deep breathing, or progressive  relaxation techniques. Yoga or tai chi. Biofeedback, mindfulness techniques, or journaling. Listening to music, being in nature, or taking part in other  hobbies. Practicing a healthy lifestyle. Eat a balanced diet, drink plenty of water, limit or avoid caffeine, and get plenty of sleep. Having a strong support network. Spend time with family, friends, or other people you enjoy being around. Express your feelings and talk things over with someone you trust. Counseling or talk therapy with a mental health provider may help if you are having trouble managing stress by yourself. Follow these instructions at home: Lifestyle  Avoid drugs. Do not use any products that contain nicotine or tobacco. These products include cigarettes, chewing tobacco, and vaping devices, such as e-cigarettes. If you need help quitting, ask your health care provider. If you drink alcohol: Limit how much you have to: 0-1 drink a day for women who are not pregnant. 0-2 drinks a day for men. Know how much alcohol is in a drink. In the [place] one drink equals one 12 oz bottle of beer (355 mL), one 5 oz glass of wine (148 mL), or one 1 oz glass of hard liquor (44 mL). Do not use alcohol or drugs to relax. Eat a balanced diet that includes fresh fruits and vegetables, whole grains, lean meats, fish, eggs, beans, and low-fat dairy. Avoid processed foods and foods high in added fat, sugar, and salt. Exercise at least 30 minutes on 5 or more days each week. Get 7-8 hours of sleep each night. General instructions  Practice stress management techniques as told by your health care provider. Drink enough fluid to keep your urine pale yellow. Take over-the-counter and prescription medicines only as told by your health care provider. Keep all follow-up visits. This is important. Contact a health care provider if: Your symptoms get worse. You have new symptoms. You feel overwhelmed by your problems and can no longer manage them by yourself. Get help right away if: You have thoughts of hurting yourself or others. Get help right awayif you feel like you may hurt yourself or others,  or have thoughts about taking your own life. Go to your nearest emergency room or: Call 911. Call the [facility] at [phone] or 988. This is open 24 hours a day. Text the Crisis Text Line at (939)405-2939. Summary Stress is a normal reaction to life events. It can cause problems if it happens too often or for too long. Practicing stress management techniques is the best way to treat stress. Counseling or talk therapy with a mental health provider may help if you are having trouble managing stress by yourself. This information is not intended to replace advice given to you by your health care provider. Make sure you discuss any questions you have with your health care provider. Document Revised: 02/10/2021 Document Reviewed: 02/10/2021 Elsevier Patient Education  [place].

## 2022-03-16 LAB — THYROID PANEL WITH TSH
Free Thyroxine Index: 2.3 (ref 1.2–4.9)
T3 Uptake Ratio: 29 % (ref 24–39)
T4, Total: 8.1 ug/dL (ref 4.5–12.0)
TSH: 0.809 u[IU]/mL (ref 0.450–4.500)

## 2022-03-16 LAB — LIPID PANEL
Chol/HDL Ratio: 2.2 ratio (ref 0.0–4.4)
Cholesterol, Total: 101 mg/dL (ref 100–199)
HDL: 45 mg/dL (ref >39)
LDL Chol Calc (NIH): 42 mg/dL (ref 0–99)
Triglycerides: 66 mg/dL (ref 0–149)
VLDL Cholesterol Cal: 14 mg/dL (ref 5–40)

## 2022-03-16 LAB — CBC WITH DIFFERENTIAL/PLATELET
Basophils Absolute: 0.1 10*3/uL (ref 0.0–0.2)
Basos: 1 %
EOS (ABSOLUTE): 0.1 10*3/uL (ref 0.0–0.4)
Eos: 2 %
Hematocrit: 41.8 % (ref 34.0–46.6)
Hemoglobin: 13.6 g/dL (ref 11.1–15.9)
Immature Grans (Abs): 0 10*3/uL (ref 0.0–0.1)
Immature Granulocytes: 0 %
Lymphocytes Absolute: 2.8 10*3/uL (ref 0.7–3.1)
Lymphs: 44 %
MCH: 28.4 pg (ref 26.6–33.0)
MCHC: 32.5 g/dL (ref 31.5–35.7)
MCV: 87 fL (ref 79–97)
Monocytes Absolute: 0.5 10*3/uL (ref 0.1–0.9)
Monocytes: 9 %
Neutrophils Absolute: 2.8 10*3/uL (ref 1.4–7.0)
Neutrophils: 44 %
Platelets: 298 10*3/uL (ref 150–450)
RBC: 4.79 x10E6/uL (ref 3.77–5.28)
RDW: 12 % (ref 11.7–15.4)
WBC: 6.3 10*3/uL (ref 3.4–10.8)

## 2022-03-16 LAB — COMPREHENSIVE METABOLIC PANEL
ALT: 6 IU/L (ref 0–32)
AST: 10 IU/L (ref 0–40)
Albumin/Globulin Ratio: 1.3 (ref 1.2–2.2)
Albumin: 3.8 g/dL — ABNORMAL LOW (ref 3.9–4.9)
Alkaline Phosphatase: 85 IU/L (ref 44–121)
BUN/Creatinine Ratio: 18 (ref 9–23)
BUN: 14 mg/dL (ref 6–20)
Bilirubin Total: 0.2 mg/dL (ref 0.0–1.2)
CO2: 23 mmol/L (ref 20–29)
Calcium: 8.5 mg/dL — ABNORMAL LOW (ref 8.7–10.2)
Chloride: 103 mmol/L (ref 96–106)
Creatinine, Ser: 0.78 mg/dL (ref 0.57–1.00)
Globulin, Total: 3 g/dL (ref 1.5–4.5)
Glucose: 107 mg/dL — ABNORMAL HIGH (ref 70–99)
Potassium: 4.7 mmol/L (ref 3.5–5.2)
Sodium: 141 mmol/L (ref 134–144)
Total Protein: 6.8 g/dL (ref 6.0–8.5)
eGFR: 103 mL/min/{1.73_m2} (ref >59)

## 2022-03-16 LAB — HEMOGLOBIN A1C
Est. average glucose Bld gHb Est-mCnc: 126 mg/dL
Hgb A1c MFr Bld: 6 % — ABNORMAL HIGH (ref 4.8–5.6)

## 2022-04-18 ENCOUNTER — Ambulatory Visit: Payer: Medicaid Other | Attending: Family Medicine | Admitting: Pharmacist

## 2022-04-18 DIAGNOSIS — I10 Essential (primary) hypertension: Secondary | ICD-10-CM

## 2022-04-18 MED ORDER — BLOOD PRESSURE CUFF MISC: 0 refills | Status: DC

## 2022-04-18 NOTE — Progress Notes (Signed)
Virtual Visit via Video Note  I connected with Gloria Chase on 04/18/22 at  2:00 PM EDT by a video enabled telemedicine application and verified that I am speaking with the correct person using two identifiers.  Location: Patient: home Provider: office at work Only me and the patient participated in this call.    I discussed the limitations of evaluation and management by telemedicine and the availability of in person appointments. The patient expressed understanding and agreed to proceed.  I provided 20 minutes of non-face-to-face time during this encounter.  S:     No chief complaint on file.  Gloria Chase is a 31 y.o. female who presents for hypertension evaluation, education, and management.  PMH is significant for HTN, obesity, GAD, MDD.  Patient was referred and last seen by Freeman Caldron on 03/15/2022.   At last visit, BP was 140/100 mmHg. She did endorse increased anxiety at that visit. She also endorsed poor social determinants of health. No medication changes were made at that appt.   Today, patient seems to be in good spirits. Denies dizziness, headache, blurred vision, swelling.   Patient reports hypertension was diagnosed 5 years ago. Denies any history of clinical ASCVD.   Family/Social history:  Fhx: HTN, CHF Tobacco: never smoker  Alcohol: occasional use  Medication adherence reported. Patient has taken BP medications today.   Current antihypertensives include: carvedilol 12.5 mg BID (tells me this was started at Urgent Care d/t a fast heart rate), triamterene-HCTZ 37.5-25 mg daily  Antihypertensives tried in the past include: none  Reported home BP readings:  -Checks at [employer]. Does not have a home cuff.  -Reports 117/82 mmHg last Wednesday.   Patient reported dietary habits:  -Denies excessive intake of sodium -Tries to eat lean poultry -Uses sodium-free seasoning  -Does not drink caffeine   Patient-reported exercise habits:  -No  formal exercise regimen at home  O:  ROS  Physical Exam  Last 3 Office BP readings: BP Readings from Last 3 Encounters:  03/15/22 (!) 140/100  06/23/21 115/75  03/10/21 (!) 132/93    BMET    Component Value Date/Time   NA 141 03/15/2022 0947   K 4.7 03/15/2022 0947   CL 103 03/15/2022 0947   CO2 23 03/15/2022 0947   GLUCOSE 107 (H) 03/15/2022 0947   GLUCOSE 124 (H) 10/18/2019 1709   BUN 14 03/15/2022 0947   CREATININE 0.78 03/15/2022 0947   CALCIUM 8.5 (L) 03/15/2022 0947   GFRNONAA >60 10/18/2019 1709   GFRAA >60 10/18/2019 1709    Renal function: CrCl cannot be calculated (Patient's most recent lab result is older than the maximum 21 days allowed.).  Clinical ASCVD: No  The ASCVD Risk score (Arnett DK, et al., 2019) failed to calculate for the following reasons:   The 2019 ASCVD risk score is only valid for ages 36 to 35  Patient is participating in a Managed Medicaid Plan:  Yes    A/P: Hypertension diagnosed with current control level unknown on current medications. BP goal < 130/80 mmHg. Medication adherence appears appropriate and the BP reading she got last week was good.  -Continued current regimen for now. -BP machine sent to [facility]. Recommend to check and record BP readings at home. -Encouraged 150 mins/week of aerobic exercise.  -F/u labs ordered - none -Counseled on lifestyle modifications for blood pressure control including reduced dietary sodium, increased exercise, adequate sleep. -Encouraged patient to check BP at home and bring log of readings to next  visit. Counseled on proper use of home BP cuff.   Results reviewed and written information provided.    Patient instructions provided. Patient verbalized understanding of treatment plan.   Follow-up:  Pharmacist 2 months.  Butch Penny, PharmD, Patsy [place], CPP Clinical Pharmacist [facility] & [facility] [phone]

## 2022-06-16 ENCOUNTER — Ambulatory Visit: Payer: Medicaid Other | Admitting: Pharmacist

## 2022-09-11 ENCOUNTER — Other Ambulatory Visit: Payer: Self-pay | Admitting: Obstetrics

## 2022-09-11 DIAGNOSIS — B9689 Other specified bacterial agents as the cause of diseases classified elsewhere: Secondary | ICD-10-CM

## 2022-11-01 NOTE — Telephone Encounter (Signed)
done

## 2022-11-14 ENCOUNTER — Emergency Department (HOSPITAL_COMMUNITY)
Admission: EM | Admit: 2022-11-14 | Discharge: 2022-11-14 | Disposition: A | Discharge status: Home / Self Care | Payer: Medicaid Other | Attending: Emergency Medicine | Admitting: Emergency Medicine

## 2022-11-14 ENCOUNTER — Encounter (HOSPITAL_COMMUNITY): Payer: Self-pay

## 2022-11-14 DIAGNOSIS — Y92410 Unspecified street and highway as the place of occurrence of the external cause: Secondary | ICD-10-CM | POA: Diagnosis not present

## 2022-11-14 DIAGNOSIS — M25511 Pain in right shoulder: Secondary | ICD-10-CM | POA: Insufficient documentation

## 2022-11-14 DIAGNOSIS — M542 Cervicalgia: Secondary | ICD-10-CM | POA: Diagnosis not present

## 2022-11-14 DIAGNOSIS — R0789 Other chest pain: Secondary | ICD-10-CM | POA: Insufficient documentation

## 2022-11-14 DIAGNOSIS — R519 Headache, unspecified: Secondary | ICD-10-CM | POA: Diagnosis present

## 2022-11-14 DIAGNOSIS — S0081XA Abrasion of other part of head, initial encounter: Secondary | ICD-10-CM | POA: Insufficient documentation
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
## 2022-11-14 NOTE — Discharge Instructions (Signed)
You will hurt worse tomorrow that is normal.  Please return for confusion or vomiting.  Please follow-up with your family doctor in the office. Take 4 over the counter ibuprofen tablets 3 times a day or 2 over-the-counter naproxen tablets twice a day for pain. Also take tylenol 1000mg (2 extra strength) four times a day.

## 2022-12-04 ENCOUNTER — Other Ambulatory Visit: Payer: Self-pay | Admitting: Physician Assistant

## 2022-12-04 DIAGNOSIS — I10 Essential (primary) hypertension: Secondary | ICD-10-CM

## 2022-12-05 ENCOUNTER — Other Ambulatory Visit: Payer: Self-pay | Admitting: Family Medicine

## 2022-12-05 ENCOUNTER — Ambulatory Visit (HOSPITAL_COMMUNITY): Payer: Self-pay

## 2022-12-05 DIAGNOSIS — N944 Primary dysmenorrhea: Secondary | ICD-10-CM

## 2022-12-05 NOTE — Telephone Encounter (Signed)
Requested medications are due for refill today.  yes  Requested medications are on the active medications list.  yes  Last refill. 03/15/2022 #90 1 rf  Future visit scheduled.   no  Notes to clinic.  Cammie Fulp listed as PCP.     Requested Prescriptions  Pending Prescriptions Disp Refills   triamterene-hydrochlorothiazide (DYAZIDE) 37.5-25 MG capsule [Pharmacy Med Name: TRIAMTERENE-HCTZ 37.5-25 MG CP] 60 capsule 1    Sig: Take 1 each (1 capsule total) by mouth daily.     Cardiovascular: Diuretic Combos Failed - 12/04/2022  7:16 PM      Failed - K in normal range and within 180 days    Potassium  Date Value Ref Range Status  03/15/2022 4.7 3.5 - 5.2 mmol/L Final         Failed - Na in normal range and within 180 days    Sodium  Date Value Ref Range Status  03/15/2022 141 134 - 144 mmol/L Final         Failed - Cr in normal range and within 180 days    Creatinine, Ser  Date Value Ref Range Status  03/15/2022 0.78 0.57 - 1.00 mg/dL Final         Failed - Last BP in normal range    BP Readings from Last 1 Encounters:  11/14/22 (!) 143/90         Failed - Valid encounter within last 6 months    Recent Outpatient Visits           7 months ago HTN (hypertension), benign   [facility] [place], Cornelius Moras, RPH-CPP   8 months ago Prediabetes   [facility] [place], [place], [place]   1 year ago Prediabetes   [facility] [place], [place], [place]   2 years ago Hyperglycemia   [facility] [place], Marzella Schlein, [place]   2 years ago Well female exam without gynecological exam   [facility] Cain Saupe, MD       Future Appointments             Today [facility]

## 2023-03-02 ENCOUNTER — Other Ambulatory Visit: Payer: Self-pay | Admitting: Physician Assistant

## 2023-03-02 DIAGNOSIS — I10 Essential (primary) hypertension: Secondary | ICD-10-CM

## 2023-04-11 ENCOUNTER — Ambulatory Visit: Payer: Self-pay | Admitting: *Deleted

## 2023-04-11 NOTE — Telephone Encounter (Signed)
Noted  

## 2023-04-11 NOTE — Telephone Encounter (Signed)
anxiety Reason for Disposition  MODERATE anxiety (e.g., persistent or frequent anxiety symptoms; interferes with sleep, school, or work)  Answer Assessment - Initial Assessment Questions 1. CONCERN: "Did anything happen that prompted you to call today?"      Anxiety- almost out of BP medication, out of anxiety medication  2. ANXIETY SYMPTOMS: "Can you describe how you (your loved one; patient) have been feeling?" (e.g., tense, restless, panicky, anxious, keyed up, overwhelmed, sense of impending doom).      Homeless, family stress- child 3. ONSET: "How long have you been feeling this way?" (e.g., hours, days, weeks)     Patient has been suffering for  a few months- up/down 4. SEVERITY: "How would you rate the level of anxiety?" (e.g., 0 - 10; or mild, moderate, severe).     Moderate/severe 5. FUNCTIONAL IMPAIRMENT: "How have these feelings affected your ability to do daily activities?" "Have you had more difficulty than usual doing your normal daily activities?" (e.g., getting better, same, worse; self-care, school, work, interactions)     Patient is trying to make ends meet 6. HISTORY: "Have you felt this way before?" "Have you ever been diagnosed with an anxiety problem in the past?" (e.g., generalized anxiety disorder, panic attacks, PTSD). If Yes, ask: "How was this problem treated?" (e.g., medicines, counseling, etc.)     Hx anxiety 7. RISK OF HARM - SUICIDAL IDEATION: "Do you ever have thoughts of hurting or killing yourself?" If Yes, ask:  "Do you have these feelings now?" "Do you have a plan on how you would do this?"     No, no plans 8. TREATMENT:  "What has been done so far to treat this anxiety?" (e.g., medicines, relaxation strategies). "What has helped?"     Patient has been on medication in the past- she is not on medictaiuon now 9. TREATMENT - THERAPIST: "Do you have a counselor or therapist? Name?"     no 10. POTENTIAL TRIGGERS: "Do you drink caffeinated beverages (e.g.,  coffee, colas, teas), and how much daily?" "Do you drink alcohol or use any drugs?" "Have you started any new medicines recently?"       Patient advised to avoid any triggers  Protocols used: Anxiety and Panic Attack-A-AH

## 2023-04-11 NOTE — Telephone Encounter (Signed)
  Chief Complaint: anxiety- no medication  Symptoms: anxiety, recent status change- homeless, teen daughter struggling Frequency: ongoing Pertinent Negatives: Patient denies suicidal thoughts/plans Disposition: [] ED /[] Urgent Care (no appt availability in office) / [] Appointment(In office/virtual)/ []  [place] [facility]/ [] Home Care/ [] Refused Recommended Disposition /[x] [place] [facility]/ []  Follow-up with PCP Additional Notes: Patient is going to go to mobile unit today. She has been given information on finding [facility] schedule, [facility] and ED for severe symptoms. Patient does have appointment 05/10/23 in office.

## 2023-04-24 ENCOUNTER — Other Ambulatory Visit: Payer: Self-pay

## 2023-04-24 ENCOUNTER — Ambulatory Visit: Payer: Self-pay | Admitting: Physician Assistant

## 2023-04-24 ENCOUNTER — Encounter: Payer: Self-pay | Admitting: Physician Assistant

## 2023-04-24 VITALS — BP 184/114 | HR 64 | Ht 65.0 in | Wt 239.0 lb

## 2023-04-24 DIAGNOSIS — F411 Generalized anxiety disorder: Secondary | ICD-10-CM

## 2023-04-24 DIAGNOSIS — F439 Reaction to severe stress, unspecified: Secondary | ICD-10-CM

## 2023-04-24 DIAGNOSIS — Z13220 Encounter for screening for lipoid disorders: Secondary | ICD-10-CM

## 2023-04-24 DIAGNOSIS — R0789 Other chest pain: Secondary | ICD-10-CM

## 2023-04-24 DIAGNOSIS — I160 Hypertensive urgency: Secondary | ICD-10-CM

## 2023-04-24 DIAGNOSIS — Z789 Other specified health status: Secondary | ICD-10-CM

## 2023-04-24 DIAGNOSIS — F5104 Psychophysiologic insomnia: Secondary | ICD-10-CM

## 2023-04-24 DIAGNOSIS — A6000 Herpesviral infection of urogenital system, unspecified: Secondary | ICD-10-CM

## 2023-04-24 DIAGNOSIS — R001 Bradycardia, unspecified: Secondary | ICD-10-CM

## 2023-04-24 DIAGNOSIS — R7303 Prediabetes: Secondary | ICD-10-CM

## 2023-04-24 DIAGNOSIS — I10 Essential (primary) hypertension: Secondary | ICD-10-CM

## 2023-04-24 LAB — POCT GLYCOSYLATED HEMOGLOBIN (HGB A1C): Hemoglobin A1C: 5.8 % — AB (ref 4.0–5.6)

## 2023-04-24 MED ORDER — VALACYCLOVIR HCL 1 G PO TABS
1000.0000 mg | ORAL_TABLET | Freq: Two times a day (BID) | ORAL | 5 refills | Status: AC
  Filled 2023-04-24: qty 30, 15d supply, fill #0
  Filled 2023-06-11 – 2023-10-26 (×2): qty 30, 15d supply, fill #1

## 2023-04-24 MED ORDER — CLONIDINE HCL 0.1 MG PO TABS
0.1000 mg | ORAL_TABLET | Freq: Once | ORAL | Status: AC
Start: 2023-04-24 — End: 2023-04-24
  Administered 2023-04-24: 0.1 mg via ORAL

## 2023-04-24 MED ORDER — TRIAMTERENE-HCTZ 37.5-25 MG PO CAPS
1.0000 | ORAL_CAPSULE | Freq: Every day | ORAL | 1 refills | Status: DC
  Filled 2023-04-24: qty 30, 30d supply, fill #0
  Filled 2023-06-11 – 2023-06-26 (×3): qty 30, 30d supply, fill #1

## 2023-04-24 MED ORDER — CARVEDILOL 12.5 MG PO TABS
12.5000 mg | ORAL_TABLET | Freq: Two times a day (BID) | ORAL | 1 refills | Status: DC
  Filled 2023-04-24: qty 60, 30d supply, fill #0
  Filled 2023-06-11 – 2023-06-26 (×2): qty 60, 30d supply, fill #1

## 2023-04-24 MED ORDER — CITALOPRAM HYDROBROMIDE 10 MG PO TABS
10.0000 mg | ORAL_TABLET | Freq: Every day | ORAL | 3 refills | Status: DC
  Filled 2023-04-24: qty 30, 30d supply, fill #0
  Filled 2023-06-11 – 2023-09-21 (×2): qty 30, 30d supply, fill #1
  Filled 2023-10-26 – 2023-11-28 (×2): qty 30, 30d supply, fill #2

## 2023-04-24 MED ORDER — HYDROXYZINE HCL 25 MG PO TABS
25.0000 mg | ORAL_TABLET | Freq: Every evening | ORAL | 0 refills | Status: DC | PRN
  Filled 2023-04-24: qty 30, 30d supply, fill #0

## 2023-04-24 MED ORDER — BLOOD PRESSURE CUFF MISC
0 refills | Status: DC
Start: 2023-04-24 — End: 2023-06-11

## 2023-04-24 MED ORDER — BUSPIRONE HCL 10 MG PO TABS
10.0000 mg | ORAL_TABLET | Freq: Two times a day (BID) | ORAL | 3 refills | Status: DC
  Filled 2023-04-24: qty 60, 30d supply, fill #0
  Filled 2023-06-11 – 2023-09-21 (×2): qty 60, 30d supply, fill #1
  Filled 2023-10-26 – 2023-11-28 (×2): qty 60, 30d supply, fill #2

## 2023-04-24 NOTE — Progress Notes (Unsigned)
Established Patient Office Visit  Subjective   Patient ID: Gloria Chase, female    DOB: 1990-10-30  Age: 32 y.o. MRN: [id number]  Chief Complaint  Patient presents with   Medication Refill   Chest Injury    Left side,     States that she has been out of all of her medications, states that she has been experiencing increased anxiety, and stress.  Does endorse increased life stressors.  States that she has been experiencing left-sided chest pain for the past 2 days, denies shortness of breath.  States that she has been having some difficulty sleeping both falling asleep and staying asleep      03/10/2021    9:41 AM 05/12/2020    3:12 PM 04/09/2020    1:36 PM 03/09/2019    1:42 PM 01/22/2019   10:44 PM  Depression screen PHQ 2/9  Decreased Interest 3 1 0 0 0  Down, Depressed, Hopeless 1 3 0 0 0  PHQ - 2 Score 4 4 0 0 0  Altered sleeping 1 3 0 0 0  Tired, decreased energy 1 3 0 0 0  Change in appetite 0 2 0 0 0  Feeling bad or failure about yourself  0 3 0 0 0  Trouble concentrating 0 3 0 0 0  Moving slowly or fidgety/restless 0 0 0 0 0  Suicidal thoughts 0 1 0 0 0  PHQ-9 Score 6 19 0 0 0  Difficult doing work/chores Not difficult at all Extremely dIfficult         03/10/2021    9:41 AM 05/12/2020    3:09 PM 04/09/2020    1:36 PM 06/01/2017   12:04 PM  GAD 7 : Generalized Anxiety Score  Nervous, Anxious, on Edge 3 3 0 3  Control/stop worrying 3 3 0 3  Worry too much - different things 3 3 0 3  Trouble relaxing 3 3 0 3  Restless 1 1 0 1  Easily annoyed or irritable 2 3 0 3  Afraid - awful might happen 3 1 0 1  Total GAD 7 Score 18 17 0 17  Anxiety Difficulty Not difficult at all Extremely difficult       Past Medical History:  Diagnosis Date   Anemia    History of colposcopy with cervical biopsy 01/11/2011   History of trichomoniasis    History of wisdom tooth extraction 2005   Hx of hernia repair 2000   Hypertension    Morbid obesity (HCC)    Social  History   Socioeconomic History   Marital status: Single    Spouse name: Not on file   Number of children: Not on file   Years of education: Not on file   Highest education level: Not on file  Occupational History   Not on file  Tobacco Use   Smoking status: Never   Smokeless tobacco: Never  Vaping Use   Vaping status: Never Used  Substance and Sexual Activity   Alcohol use: Not Currently    Comment: social   Drug use: Yes    Types: Marijuana   Sexual activity: Yes    Partners: Male    Birth control/protection: None  Other Topics Concern   Not on file  Social History Narrative   Not on file   Social Determinants of Health   Financial Resource Strain: Not on file  Food Insecurity: Not on file  Transportation Needs: Not on file  Physical Activity: Not on file  Stress: Not on file  Social Connections: Not on file  Intimate Partner Violence: Not on file   Family History  Problem Relation Age of Onset   Hypertension Mother    Hypertension Father    Hypertension Maternal Grandmother    Hypertension Maternal Grandfather    Colon cancer Maternal Grandfather    No Known Allergies  Review of Systems  Constitutional: Negative.   HENT: Negative.    Eyes: Negative.   Respiratory:  Negative for shortness of breath.   Cardiovascular:  Positive for chest pain. Negative for leg swelling.  Gastrointestinal: Negative.   Genitourinary: Negative.   Musculoskeletal: Negative.   Skin: Negative.   Neurological: Negative.   Endo/Heme/Allergies: Negative.   Psychiatric/Behavioral:  Positive for depression. The patient is nervous/anxious and has insomnia.       Objective:     BP (!) 184/114   Pulse 64   Ht 5\' 5"  (1.651 m)   Wt 239 lb (108.4 kg)   LMP 03/31/2023   SpO2 100%   BMI 39.77 kg/m  BP Readings from Last 3 Encounters:  04/24/23 (!) 184/114  11/14/22 (!) 143/90  03/15/22 (!) 140/100   Wt Readings from Last 3 Encounters:  04/24/23 239 lb (108.4 kg)   11/14/22 220 lb (99.8 kg)  03/15/22 243 lb 3.2 oz (110.3 kg)    Physical Exam Vitals and nursing note reviewed.  Constitutional:      Appearance: Normal appearance.  HENT:     Head: Normocephalic and atraumatic.     Right Ear: External ear normal.     Left Ear: External ear normal.     Nose: Nose normal.     Mouth/Throat:     Mouth: Mucous membranes are moist.     Pharynx: Oropharynx is clear.  Eyes:     Extraocular Movements: Extraocular movements intact.     Conjunctiva/sclera: Conjunctivae normal.     Pupils: Pupils are equal, round, and reactive to light.  Cardiovascular:     Rate and Rhythm: Regular rhythm. Bradycardia present.     Pulses: Normal pulses.     Heart sounds: Normal heart sounds.  Pulmonary:     Effort: Pulmonary effort is normal.     Breath sounds: Normal breath sounds.  Musculoskeletal:        General: Normal range of motion.     Cervical back: Normal range of motion and neck supple.  Skin:    General: Skin is warm and dry.  Neurological:     General: No focal deficit present.     Mental Status: She is alert and oriented to person, place, and time.  Psychiatric:        Mood and Affect: Mood normal. Affect is tearful.        Behavior: Behavior normal.        Thought Content: Thought content normal.        Judgment: Judgment normal.        Assessment & Plan:   Problem List Items Addressed This Visit       Other   GAD (generalized anxiety disorder) - Primary   Relevant Medications   busPIRone (BUSPAR) 10 MG tablet   citalopram (CELEXA) 10 MG tablet   hydrOXYzine (ATARAX) 25 MG tablet   Other Relevant Orders   Ambulatory referral to Social Work   Other Visit Diagnoses     Prediabetes       Relevant Orders   HgB A1c (Completed)   Essential hypertension  Relevant Medications   carvedilol (COREG) 12.5 MG tablet   triamterene-hydrochlorothiazide (DYAZIDE) 37.5-25 MG capsule   cloNIDine (CATAPRES) tablet 0.1 mg (Completed)   Other  Relevant Orders   CBC with Differential/Platelet (Completed)   Comp. Metabolic Panel (12) (Completed)   EKG 12-Lead (Completed)   Other chest pain       Screening, lipid       Relevant Orders   Lipid panel (Completed)   Stress       Relevant Medications   busPIRone (BUSPAR) 10 MG tablet   citalopram (CELEXA) 10 MG tablet   Need for follow-up by social worker       Relevant Medications   busPIRone (BUSPAR) 10 MG tablet   citalopram (CELEXA) 10 MG tablet   HTN (hypertension), benign       Relevant Medications   carvedilol (COREG) 12.5 MG tablet   triamterene-hydrochlorothiazide (DYAZIDE) 37.5-25 MG capsule   cloNIDine (CATAPRES) tablet 0.1 mg (Completed)   Blood Pressure Monitoring (BLOOD PRESSURE CUFF) MISC   Genital herpes simplex, unspecified site       Relevant Medications   valACYclovir (VALTREX) 1000 MG tablet   Psychophysiological insomnia       Relevant Medications   hydrOXYzine (ATARAX) 25 MG tablet   Hypertensive urgency       Relevant Medications   carvedilol (COREG) 12.5 MG tablet   triamterene-hydrochlorothiazide (DYAZIDE) 37.5-25 MG capsule   cloNIDine (CATAPRES) tablet 0.1 mg (Completed)   Bradycardia         1. Essential hypertension Resume previous regimen.  However will have patient hold carvedilol at this time due to episodes of bradycardia.  Patient encouraged to check blood pressure and pulse twice daily for the next 2 weeks, patient does have appointment with primary care provider at that time.  Patient encouraged to return to mobile unit as needed.  Red flags given for prompt reevaluation - CBC with Differential/Platelet - Comp. Metabolic Panel (12) - carvedilol (COREG) 12.5 MG tablet; Take 1 tablet (12.5 mg total) by mouth 2 (two) times daily with a meal.  Dispense: 60 tablet; Refill: 1 - triamterene-hydrochlorothiazide (DYAZIDE) 37.5-25 MG capsule; Take 1 each (1 capsule total) by mouth daily.  Dispense: 30 capsule; Refill: 1 - Blood Pressure  Monitoring (BLOOD PRESSURE CUFF) MISC; Use to check blood pressure once daily.  Dispense: 1 each; Refill: 0  2. Hypertensive urgency Patient given clonidine in clinic.  Red flags given for prompt reevaluation - cloNIDine (CATAPRES) tablet 0.1 mg  3. Bradycardia Patient education given on supportive care  4. Other chest pain EKG shows bradycardia - EKG 12-Lead  5. Prediabetes A1C 5.8 patient education given on low sugar diet - HgB A1c  6. GAD (generalized anxiety disorder) Resume regimen.  Patient agreeable to referral for CBT - busPIRone (BUSPAR) 10 MG tablet; Take 1 tablet (10 mg total) by mouth 2 (two) times daily.  Dispense: 60 tablet; Refill: 3 - citalopram (CELEXA) 10 MG tablet; Take 1 tablet (10 mg total) by mouth daily.  Dispense: 30 tablet; Refill: 3 - Ambulatory referral to Social Work  7. Stress  - busPIRone (BUSPAR) 10 MG tablet; Take 1 tablet (10 mg total) by mouth 2 (two) times daily.  Dispense: 60 tablet; Refill: 3 - citalopram (CELEXA) 10 MG tablet; Take 1 tablet (10 mg total) by mouth daily.  Dispense: 30 tablet; Refill: 3  8. Psychophysiological insomnia Trial hydroxyzine.  Patient education given on good sleep hygiene - hydrOXYzine (ATARAX) 25 MG tablet; Take 1 tablet (25 mg  total) by mouth at bedtime as needed.  Dispense: 30 tablet; Refill: 0  9. Screening, lipid Fasting labs completed today - Lipid panel  10. Need for follow-up by social worker  - busPIRone (BUSPAR) 10 MG tablet; Take 1 tablet (10 mg total) by mouth 2 (two) times daily.  Dispense: 60 tablet; Refill: 3 - citalopram (CELEXA) 10 MG tablet; Take 1 tablet (10 mg total) by mouth daily.  Dispense: 30 tablet; Refill: 3  11. Genital herpes simplex, unspecified site Resume as needed - valACYclovir (VALTREX) 1000 MG tablet; Take 1 tablet (1,000 mg total) by mouth 2 (two) times daily. Take for 3 days, with each outbreak.  Dispense: 30 tablet; Refill: 5   I have reviewed the patient's medical  history (PMH, PSH, Social History, Family History, Medications, and allergies) , and have been updated if relevant. I spent 80 minutes reviewing chart and  face to face time with patient.    Return in about 16 days (around 05/10/2023) for At [facility].    Kasandra Knudsen Mayers, PA-C

## 2023-04-24 NOTE — Patient Instructions (Addendum)
To help you with sleep, I would like you to start taking hydroxyzine 25 mg at bedtime.  Please start checking your blood pressure and pulse on a daily basis, keep a written log and have available for all office visits.  I do encourage you to hold the carvedilol at this time due to lower pulse rate.  I started a referral for counseling, they will reach out to you to schedule.  We will call you with today's lab results.  Roney Jaffe, PA-C Physician Assistant [facility] [email or web address]   Bradycardia, Adult Bradycardia is a slower-than-normal heartbeat. A normal resting heart rate for an adult ranges from 60 to 100 beats per minute. With bradycardia, the resting heart rate is less than 60 beats per minute. Bradycardia can prevent enough oxygen from reaching certain areas of your body when you are active. It can be serious if it keeps enough oxygen from reaching your brain and other parts of your body. Bradycardia is not a problem for everyone. For some healthy adults, a slow resting heart rate is normal. What are the causes? This condition may be caused by: A problem with the heart, including: A problem with the heart's electrical system, such as a heart block. With a heart block, electrical signals between the chambers of the heart are partially or completely blocked, so they are not able to work as they should. A problem with the heart's natural pacemaker (sinus node). Heart disease. A heart attack. Heart damage. Lyme disease. A heart infection. A heart condition that is present at birth (congenital heart defect). Certain medicines that treat heart conditions. Certain conditions, such as hypothyroidism and obstructive sleep apnea. Problems with the balance of chemicals and other substances, like potassium, in the blood. Trauma. Radiation therapy. What increases the risk? You are more likely to develop this condition if  you: Are age 68 or older. Have high blood pressure (hypertension), high cholesterol (hyperlipidemia), or diabetes. Drink heavily, use tobacco or nicotine products, or use drugs. What are the signs or symptoms? Symptoms of this condition include: Light-headedness. Feeling faint or fainting. Fatigue and weakness. Trouble with activity or exercise. Shortness of breath. Chest pain (angina). Drowsiness. Confusion. Dizziness. How is this diagnosed? This condition may be diagnosed based on: Your symptoms. Your medical history. A physical exam. During the exam, your health care provider will listen to your heartbeat and check your pulse. To confirm the diagnosis, your health care provider may order tests, such as: Blood tests. An electrocardiogram (ECG). This test records the heart's electrical activity. The test can show how fast your heart is beating and whether the heartbeat is steady. A test in which you wear a portable device (event recorder or Holter monitor) to record your heart's electrical activity while you go about your day. An exercise test. How is this treated? Treatment for this condition depends on the cause of the condition and how severe your symptoms are. Treatment may involve: Treatment of the underlying condition. Changing your medicines or how much medicine you take. Having a small, battery-operated device called a pacemaker implanted under the skin. When bradycardia occurs, this device can be used to increase your heart rate and help your heart beat in a regular rhythm. Follow these instructions at home: Lifestyle Manage any health conditions that contribute to bradycardia as told by your health care provider. Follow a heart-healthy diet. A nutrition specialist (dietitian) can help educate you about healthy food options and changes. Follow an exercise program that is approved  by your health care provider. Maintain a healthy weight. Try to reduce or manage your  stress, such as with yoga or meditation. If you need help reducing stress, ask your health care provider. Do not use any products that contain nicotine or tobacco. These products include cigarettes, chewing tobacco, and vaping devices, such as e-cigarettes. If you need help quitting, ask your health care provider. Do not use illegal drugs. Alcohol use If you drink alcohol: Limit how much you have to: 0-1 drink a day for women who are not pregnant. 0-2 drinks a day for men. Know how much alcohol is in a drink. In the [place] one drink equals one 12 oz bottle of beer (355 mL), one 5 oz glass of wine (148 mL), or one 1 oz glass of hard liquor (44 mL). General instructions Take over-the-counter and prescription medicines only as told by your health care provider. Keep all follow-up visits. This is important. How is this prevented? In some cases, bradycardia may be prevented by: Treating underlying medical problems. Stopping behaviors or medicines that can trigger the condition. Contact a health care provider if: You feel light-headed or dizzy. You almost faint. You feel weak or are easily fatigued during physical activity. You experience confusion or have memory problems. Get help right away if: You faint. You have chest pains or an irregular heartbeat (palpitations). You have trouble breathing. These symptoms may represent a serious problem that is an emergency. Do not wait to see if the symptoms will go away. Get medical help right away. Call your local emergency services (911 in the [place] Do not drive yourself to the hospital. Summary Bradycardia is a slower-than-normal heartbeat. With bradycardia, the resting heart rate is less than 60 beats per minute. Treatment for this condition depends on the cause. Manage any health conditions that contribute to bradycardia as told by your health care provider. Do not use any products that contain nicotine or tobacco. These products include  cigarettes, chewing tobacco, and vaping devices, such as e-cigarettes. Keep all follow-up visits. This is important. This information is not intended to replace advice given to you by your health care provider. Make sure you discuss any questions you have with your health care provider. Document Revised: 10/24/2020 Document Reviewed: 10/24/2020 Elsevier Patient Education  2024 [employer].

## 2023-04-25 ENCOUNTER — Encounter: Payer: Self-pay | Admitting: Physician Assistant

## 2023-04-25 DIAGNOSIS — A6000 Herpesviral infection of urogenital system, unspecified: Secondary | ICD-10-CM | POA: Insufficient documentation

## 2023-04-25 DIAGNOSIS — F439 Reaction to severe stress, unspecified: Secondary | ICD-10-CM | POA: Insufficient documentation

## 2023-04-25 DIAGNOSIS — R7303 Prediabetes: Secondary | ICD-10-CM | POA: Insufficient documentation

## 2023-04-25 DIAGNOSIS — F5104 Psychophysiologic insomnia: Secondary | ICD-10-CM | POA: Insufficient documentation

## 2023-04-25 LAB — CBC WITH DIFFERENTIAL/PLATELET
Basophils Absolute: 0 10*3/uL (ref 0.0–0.2)
Basos: 1 %
EOS (ABSOLUTE): 0.1 10*3/uL (ref 0.0–0.4)
Eos: 1 %
Hematocrit: 43.3 % (ref 34.0–46.6)
Hemoglobin: 13.7 g/dL (ref 11.1–15.9)
Immature Grans (Abs): 0 10*3/uL (ref 0.0–0.1)
Immature Granulocytes: 0 %
Lymphocytes Absolute: 2.7 10*3/uL (ref 0.7–3.1)
Lymphs: 44 %
MCH: 28.7 pg (ref 26.6–33.0)
MCHC: 31.6 g/dL (ref 31.5–35.7)
MCV: 91 fL (ref 79–97)
Monocytes Absolute: 0.4 10*3/uL (ref 0.1–0.9)
Monocytes: 7 %
Neutrophils Absolute: 3 10*3/uL (ref 1.4–7.0)
Neutrophils: 47 %
Platelets: 286 10*3/uL (ref 150–450)
RBC: 4.77 x10E6/uL (ref 3.77–5.28)
RDW: 12.7 % (ref 11.7–15.4)
WBC: 6.3 10*3/uL (ref 3.4–10.8)

## 2023-04-25 LAB — LIPID PANEL
Chol/HDL Ratio: 2.2 {ratio} (ref 0.0–4.4)
Cholesterol, Total: 130 mg/dL (ref 100–199)
HDL: 60 mg/dL (ref >39)
LDL Chol Calc (NIH): 56 mg/dL (ref 0–99)
Triglycerides: 66 mg/dL (ref 0–149)
VLDL Cholesterol Cal: 14 mg/dL (ref 5–40)

## 2023-04-25 LAB — COMP. METABOLIC PANEL (12)
AST: 12 [IU]/L (ref 0–40)
Albumin: 4 g/dL (ref 3.9–4.9)
Alkaline Phosphatase: 91 [IU]/L (ref 44–121)
BUN/Creatinine Ratio: 14 (ref 9–23)
BUN: 11 mg/dL (ref 6–20)
Bilirubin Total: 0.2 mg/dL (ref 0.0–1.2)
Calcium: 8.4 mg/dL — ABNORMAL LOW (ref 8.7–10.2)
Chloride: 105 mmol/L (ref 96–106)
Creatinine, Ser: 0.77 mg/dL (ref 0.57–1.00)
Globulin, Total: 3 g/dL (ref 1.5–4.5)
Glucose: 87 mg/dL (ref 70–99)
Potassium: 4.4 mmol/L (ref 3.5–5.2)
Sodium: 140 mmol/L (ref 134–144)
Total Protein: 7 g/dL (ref 6.0–8.5)
eGFR: 104 mL/min/{1.73_m2} (ref >59)

## 2023-04-27 ENCOUNTER — Other Ambulatory Visit: Payer: Self-pay

## 2023-05-10 ENCOUNTER — Telehealth: Payer: Self-pay | Admitting: Physician Assistant

## 2023-05-10 ENCOUNTER — Ambulatory Visit: Payer: Self-pay | Attending: Physician Assistant | Admitting: Physician Assistant

## 2023-05-10 ENCOUNTER — Encounter: Payer: Self-pay | Admitting: Physician Assistant

## 2023-05-10 VITALS — BP 110/74 | HR 86 | Wt 242.2 lb

## 2023-05-10 DIAGNOSIS — R7303 Prediabetes: Secondary | ICD-10-CM

## 2023-05-10 DIAGNOSIS — I10 Essential (primary) hypertension: Secondary | ICD-10-CM

## 2023-05-10 DIAGNOSIS — F5104 Psychophysiologic insomnia: Secondary | ICD-10-CM

## 2023-05-10 DIAGNOSIS — F439 Reaction to severe stress, unspecified: Secondary | ICD-10-CM

## 2023-05-10 DIAGNOSIS — F411 Generalized anxiety disorder: Secondary | ICD-10-CM

## 2023-05-10 MED ORDER — HYDROXYZINE HCL 25 MG PO TABS: 25.0000 mg | ORAL_TABLET | Freq: Every evening | ORAL | 3 refills | Status: DC | PRN

## 2023-05-10 NOTE — Patient Instructions (Signed)

## 2023-05-10 NOTE — Telephone Encounter (Signed)
Patient resuming meds for depression and anxiety.  She would also like to have counseling and support.  PHQ9=10 with #9=0.  Please see my note for more information prior to calling.  Thank you!!

## 2023-05-10 NOTE — Progress Notes (Signed)
Patient ID: [name], female   DOB: 05/07/1991, 32 y.o.   MRN: [id number]   Gloria Chase, is a 32 y.o. female  [id number]  [id number]  DOB - 1991/05/20  Chief Complaint  Patient presents with   Hypertension       Subjective:   Gloria Chase is a 32 y.o. female here today for a follow up after [facility] had restarted her BP meds.  Gloria had also RF buspar and celexa which she was on previously and it worked well for her.  She is also prescribed hydroxyzine to help with sleep.  However, she has hesitated in resuming these bc when she stopped them abruptly (ran out of them) she had severe depression with some fleeting and passive SI.  She is open to counseling and does not currently have a Veterinary surgeon.  She did feel counseling helped in the past.  She also felt she functioned A LOT better when she was taking the celexa and buspar.  She has a 89 yr old daughter that has been diagnosed with oppositional defiance d/o and that adds to her stress.  The daughter has run away and she has had to call the police on the daughter.  She works from home.    She is doing well on BP meds.  No dizziness/HA/CP/SOB.    No problems updated.  ALLERGIES: No Known Allergies  PAST MEDICAL HISTORY: Past Medical History:  Diagnosis Date   Anemia    History of colposcopy with cervical biopsy 01/11/2011   History of trichomoniasis    History of wisdom tooth extraction 2005   Hx of hernia repair 2000   Hypertension    Morbid obesity (HCC)     MEDICATIONS AT HOME: Prior to Admission medications   Medication Sig Start Date End Date Taking? Authorizing Provider  Blood Pressure Monitoring (BLOOD PRESSURE CUFF) MISC Use to check blood pressure once daily. 04/24/23  Yes Mayers, Gloria Chase  busPIRone (BUSPAR) 10 MG tablet Take 1 tablet (10 mg total) by mouth 2 (two) times daily. 04/24/23  Yes Mayers, Gloria Chase  carvedilol (COREG) 12.5 MG tablet Take 1 tablet (12.5 mg total) by mouth 2  (two) times daily with a meal. 04/24/23  Yes Mayers, Gloria Chase  citalopram (CELEXA) 10 MG tablet Take 1 tablet (10 mg total) by mouth daily. 04/24/23  Yes Mayers, Gloria Chase  triamterene-hydrochlorothiazide (DYAZIDE) 37.5-25 MG capsule Take 1 each (1 capsule total) by mouth daily. 04/24/23  Yes Mayers, Gloria Chase  valACYclovir (VALTREX) 1000 MG tablet Take 1 tablet (1,000 mg total) by mouth 2 (two) times daily. Take for 3 days, with each outbreak. 04/24/23  Yes Mayers, Gloria Chase  hydrOXYzine (ATARAX) 25 MG tablet Take 1 tablet (25 mg total) by mouth at bedtime as needed. 05/10/23   Gloria Chase, Gloria Schlein, Chase    ROS: Neg HEENT Neg resp Neg cardiac Neg GI Neg GU Neg MS Neg neuro  Objective:   Vitals:   05/10/23 1105  BP: 110/74  Pulse: 86  SpO2: 97%  Weight: 242 lb 3.2 oz (109.9 kg)   Exam General appearance : Awake, alert, not in any distress. Speech Clear. Not toxic looking HEENT: Atraumatic and Normocephalic Neck: Supple, no JVD. No cervical lymphadenopathy.  Chest: Good air entry bilaterally, CTAB.  No rales/rhonchi/wheezing CVS: S1 S2 regular, no murmurs.  Extremities: B/L Lower Ext shows no edema, both legs are warm to touch Neurology: Awake alert, and oriented X 3, CN II-XII  intact, Non focal Skin: No Rash Psych:  affect is anxious.  Mood is stable.  J/I WNL  Flowsheet Row Office Visit from 05/10/2023 in [facility] Office Visit from 03/10/2021 in [facility] from 05/12/2020 in [facility]  Thoughts that you would be better off dead, or of hurting yourself in some way Not at all Not at all Several days  [Would never act on it due to her children]  PHQ-9 Total Score 10 6 19         Data Review Lab Results  Component Value Date   HGBA1C 5.8 (A) 04/24/2023   HGBA1C 6.0 (H) 03/15/2022   HGBA1C 6.1 03/10/2021    Assessment & Plan    1. HTN (hypertension), benign Controlled-continue carvedilol and dyazide  2. Prediabetes Work at a goal of eliminating sugary drinks, candy, desserts, sweets, refined sugars, processed foods, and white carbohydrates.  Also, increase water intake   3. GAD (generalized anxiety disorder) Resume buspar and celexa-she can send me a message when next RF is up and I will send 90 day.  DO NOT STOP ABRUPTLY.  We discussed this at length and she verbalized understanding as to how abrupt cessation can severely exacerbate depression and anxiety - hydrOXYzine (ATARAX) 25 MG tablet; Take 1 tablet (25 mg total) by mouth at bedtime as needed.  Dispense: 30 tablet; Refill: 3  4. Stress Resume buspar and celexa-she can send me a message when next RF is up and I will send 90 day.  DO NOT STOP ABRUPTLY.  We discussed this at length and she verbalized understanding as to how abrupt cessation can severely exacerbate depression and anxiety - hydrOXYzine (ATARAX) 25 MG tablet; Take 1 tablet (25 mg total) by mouth at bedtime as needed.  Dispense: 30 tablet; Refill: 3  5. Psychophysiological insomnia - hydrOXYzine (ATARAX) 25 MG tablet; Take 1 tablet (25 mg total) by mouth at bedtime as needed.  Dispense: 30 tablet; Refill: 3   Return in about 3 months (around 08/10/2023) for PCP for chronic conditions-please assign to PCP .  The patient was given clear instructions to go to ER or return to medical center if symptoms don't improve, worsen or new problems develop. The patient verbalized understanding. The patient was told to call to get lab results if they haven't heard anything in the next week.      Gloria Chase, Chase [facility] and [facility] [place], [place] [phone]   05/10/2023, 11:42 AM

## 2023-05-18 ENCOUNTER — Telehealth: Payer: Self-pay | Admitting: Licensed Clinical Social Worker

## 2023-05-18 NOTE — Telephone Encounter (Signed)
LCSWA called patient today to introduce herself and to assess patients' mental health needs. Patient did not answer the phone. LCSWA was able to leave a brief message with the patient asking them to return the call. Patient was referred by PCP for Therapy services,resources were sent to pt via my chart.

## 2023-06-11 ENCOUNTER — Ambulatory Visit: Payer: Self-pay

## 2023-06-11 ENCOUNTER — Other Ambulatory Visit: Payer: Self-pay | Admitting: Physician Assistant

## 2023-06-11 ENCOUNTER — Other Ambulatory Visit (HOSPITAL_COMMUNITY): Payer: Self-pay

## 2023-06-11 DIAGNOSIS — I10 Essential (primary) hypertension: Secondary | ICD-10-CM

## 2023-06-11 NOTE — Telephone Encounter (Signed)
  Chief Complaint: medication request Symptoms: NA Frequency:  Pertinent Negatives: Patient denies any sx Disposition: [] ED /[] Urgent Care (no appt availability in office) / [x] Appointment(In office/virtual)/ []  [facility]/ [] Home Care/ [] Refused Recommended Disposition /[] [facility]/ [x]  Follow-up with PCP Additional Notes: pt states in previous OV she talked with Marylene Land, PA about metformin for prediabetes. Pt is interested in starting something to help lower A1C. She has been working on diet and exercise and feels it is lower but still wanting to see if Metformin could be prescribed. Scheduled pt for 1st OV 06/28/23 at 0950 and advised I would send message back since last OV was 05/10/23. Pt verbalized understanding.   Summary: A1C high, seeking Rx   Pt called requesting to receive a Rx to lower her A1C since it is high, please advise  Best contact: [phone]       Reason for Disposition  Prescription request for new medicine (not a refill)  Answer Assessment - Initial Assessment Questions 1. NAME of MEDICINE: "What medicine(s) are you calling about?"     metformin 2. QUESTION: "What is your question?" (e.g., double dose of medicine, side effect)     Wanting something to help lower A1C 3. PRESCRIBER: "Who prescribed the medicine?" Reason: if prescribed by specialist, call should be referred to that group.     Marylene Land, PA has dicussed it  Protocols used: Medication Question Call-A-AH

## 2023-06-12 MED ORDER — BLOOD PRESSURE CUFF MISC
0 refills | Status: DC
Start: 2023-06-12 — End: 2023-06-13

## 2023-06-13 ENCOUNTER — Other Ambulatory Visit: Payer: Self-pay | Admitting: Physician Assistant

## 2023-06-13 ENCOUNTER — Other Ambulatory Visit: Payer: Self-pay

## 2023-06-13 ENCOUNTER — Encounter: Payer: Self-pay | Admitting: Physician Assistant

## 2023-06-13 DIAGNOSIS — R7303 Prediabetes: Secondary | ICD-10-CM

## 2023-06-13 DIAGNOSIS — I10 Essential (primary) hypertension: Secondary | ICD-10-CM

## 2023-06-13 MED ORDER — METFORMIN HCL 500 MG PO TABS
500.0000 mg | ORAL_TABLET | Freq: Every day | ORAL | 1 refills | Status: DC
Start: 2023-06-13 — End: 2023-06-28
  Filled 2023-06-13 – 2023-06-26 (×3): qty 90, 90d supply, fill #0

## 2023-06-13 MED ORDER — BLOOD PRESSURE CUFF MISC
0 refills | Status: DC
Start: 2023-06-13 — End: 2023-12-23
  Filled 2023-06-13: qty 1, fill #0

## 2023-06-22 ENCOUNTER — Other Ambulatory Visit (HOSPITAL_COMMUNITY): Payer: Self-pay

## 2023-06-25 ENCOUNTER — Other Ambulatory Visit: Payer: Self-pay

## 2023-06-26 ENCOUNTER — Other Ambulatory Visit: Payer: Self-pay

## 2023-06-28 ENCOUNTER — Ambulatory Visit: Payer: Self-pay | Attending: Physician Assistant | Admitting: Physician Assistant

## 2023-06-28 ENCOUNTER — Other Ambulatory Visit: Payer: Self-pay

## 2023-06-28 VITALS — BP 140/99 | HR 59 | Wt 249.8 lb

## 2023-06-28 DIAGNOSIS — E663 Overweight: Secondary | ICD-10-CM

## 2023-06-28 DIAGNOSIS — R7303 Prediabetes: Secondary | ICD-10-CM

## 2023-06-28 DIAGNOSIS — I10 Essential (primary) hypertension: Secondary | ICD-10-CM

## 2023-06-28 DIAGNOSIS — F411 Generalized anxiety disorder: Secondary | ICD-10-CM

## 2023-06-28 MED ORDER — METFORMIN HCL 500 MG PO TABS
500.0000 mg | ORAL_TABLET | Freq: Every day | ORAL | 1 refills | Status: DC
  Filled 2023-10-26: qty 90, 90d supply, fill #0

## 2023-06-28 MED ORDER — TRIAMTERENE-HCTZ 37.5-25 MG PO CAPS
1.0000 | ORAL_CAPSULE | Freq: Every day | ORAL | 1 refills | Status: DC
  Filled 2023-09-03 – 2023-09-21 (×2): qty 90, 90d supply, fill #0
  Filled 2023-10-26: qty 90, 90d supply, fill #1

## 2023-06-28 MED ORDER — CARVEDILOL 12.5 MG PO TABS
12.5000 mg | ORAL_TABLET | Freq: Two times a day (BID) | ORAL | 1 refills | Status: DC
  Filled 2023-09-03 – 2023-09-21 (×2): qty 180, 90d supply, fill #0
  Filled 2023-10-26: qty 180, 90d supply, fill #1

## 2023-06-28 NOTE — Progress Notes (Signed)
Patient ID: Gloria Chase, female   DOB: Apr 07, 1991, 32 y.o.   MRN: [id number]   Gloria Chase, is a 32 y.o. female  [id number]  [id number]  DOB - 12/27/1990  Chief Complaint  Patient presents with   Medical Management of Chronic Issues       Subjective:   Gloria Chase is a 32 y.o. female here today for BP follow up but has been out of meds X 1 week.  She has not been checking her blood pressure cuff.  She is adamant to make lifestyle changes and lose weight.  No HA/CP or dizziness.  She is tolerating the medication ok.  She wanted to start metformin which I ordered but she has not picked it up yet.    No problems updated.  ALLERGIES: No Known Allergies  PAST MEDICAL HISTORY: Past Medical History:  Diagnosis Date   Anemia    History of colposcopy with cervical biopsy 01/11/2011   History of trichomoniasis    History of wisdom tooth extraction 2005   Hx of hernia repair 2000   Hypertension    Morbid obesity (HCC)     MEDICATIONS AT HOME: Prior to Admission medications   Medication Sig Start Date End Date Taking? Authorizing Provider  Blood Pressure Monitoring (BLOOD PRESSURE CUFF) MISC Use to check blood pressure once daily. 06/13/23  Yes Mayers, Cari S, PA-C  busPIRone (BUSPAR) 10 MG tablet Take 1 tablet (10 mg total) by mouth 2 (two) times daily. 04/24/23  Yes Mayers, Cari S, PA-C  citalopram (CELEXA) 10 MG tablet Take 1 tablet (10 mg total) by mouth daily. 04/24/23  Yes Mayers, Cari S, PA-C  hydrOXYzine (ATARAX) 25 MG tablet Take 1 tablet (25 mg total) by mouth at bedtime as needed. 05/10/23  Yes Amarian Botero M, PA-C  valACYclovir (VALTREX) 1000 MG tablet Take 1 tablet (1,000 mg total) by mouth 2 (two) times daily. Take for 3 days, with each outbreak. 04/24/23  Yes Mayers, Cari S, PA-C  carvedilol (COREG) 12.5 MG tablet Take 1 tablet (12.5 mg total) by mouth 2 (two) times daily with a meal. 06/28/23   Shelle Galdamez, Marzella Schlein, PA-C  metFORMIN (GLUCOPHAGE) 500 MG  tablet Take 1 tablet (500 mg total) by mouth daily with breakfast. 06/28/23   Anders Simmonds, PA-C  triamterene-hydrochlorothiazide (DYAZIDE) 37.5-25 MG capsule Take 1 each (1 capsule total) by mouth daily. 06/28/23   Falicity Sheets, Marzella Schlein, PA-C    ROS: Neg HEENT Neg resp Neg cardiac Neg GI Neg GU Neg MS Neg psych Neg neuro  Objective:   Vitals:   06/28/23 1000 06/28/23 1006  BP: (!) 145/106 (!) 140/99  Pulse: (!) 59   SpO2: 99%   Weight: 249 lb 12.8 oz (113.3 kg)    Exam General appearance : Awake, alert, not in any distress. Speech Clear. Not toxic looking HEENT: Atraumatic and Normocephalic Neck: Supple, no JVD. No cervical lymphadenopathy.  Chest: Good air entry bilaterally, CTAB.  No rales/rhonchi/wheezing CVS: S1 S2 regular, no murmurs.  Extremities: B/L Lower Ext shows no edema, both legs are warm to touch Neurology: Awake alert, and oriented X 3, CN II-XII intact, Non focal Skin: No Rash  Data Review Lab Results  Component Value Date   HGBA1C 5.8 (A) 04/24/2023   HGBA1C 6.0 (H) 03/15/2022   HGBA1C 6.1 03/10/2021    Assessment & Plan   1. Essential hypertension Uncontrolled but also only partially compliant and out of meds X 1 week.   Check your blood pressures daily  and record.  Sit still and quiet for 5 mins and take deep breaths prior to checking it.  Goal is <130/<85.   - carvedilol (COREG) 12.5 MG tablet; Take 1 tablet (12.5 mg total) by mouth 2 (two) times daily with a meal.  Dispense: 180 tablet; Refill: 1 - triamterene-hydrochlorothiazide (DYAZIDE) 37.5-25 MG capsule; Take 1 each (1 capsule total) by mouth daily.  Dispense: 90 capsule; Refill: 1  2. Prediabetes I have had a lengthy discussion and provided education about insulin resistance and the intake of too much sugar/refined carbohydrates.  I have advised the patient to work at a goal of eliminating sugary drinks, candy, desserts, sweets, refined sugars, processed foods, and white carbohydrates.   The patient expresses understanding.  - metFORMIN (GLUCOPHAGE) 500 MG tablet; Take 1 tablet (500 mg total) by mouth daily with breakfast.  Dispense: 90 tablet; Refill: 1  3. Overweight (Primary) See #2 and lifestyle h/o given and discussed  4. GAD (generalized anxiety disorder) Continue current regimen.  Stable on meds    Return in about 4 months (around 10/27/2023) for please assign to a PCP for BP/prediabetes.  The patient was given clear instructions to go to ER or return to medical center if symptoms don't improve, worsen or new problems develop. The patient verbalized understanding. The patient was told to call to get lab results if they haven't heard anything in the next week.      Georgian Co, PA-C [facility] and [facility] [place], [place] [phone]   06/28/2023, 10:30 AM

## 2023-06-28 NOTE — Patient Instructions (Signed)
Check your blood pressures daily and record.  Sit still and quiet for 5 mins and take deep breaths prior to checking it.  Goal is <130/<85.

## 2023-09-03 ENCOUNTER — Other Ambulatory Visit: Payer: Self-pay | Admitting: Physician Assistant

## 2023-09-03 ENCOUNTER — Other Ambulatory Visit: Payer: Self-pay

## 2023-09-03 ENCOUNTER — Other Ambulatory Visit (HOSPITAL_COMMUNITY): Payer: Self-pay

## 2023-09-03 DIAGNOSIS — F5104 Psychophysiologic insomnia: Secondary | ICD-10-CM

## 2023-09-05 ENCOUNTER — Other Ambulatory Visit: Payer: Self-pay

## 2023-09-13 ENCOUNTER — Other Ambulatory Visit: Payer: Self-pay

## 2023-09-21 ENCOUNTER — Other Ambulatory Visit: Payer: Self-pay

## 2023-09-24 ENCOUNTER — Other Ambulatory Visit: Payer: Self-pay

## 2023-10-26 ENCOUNTER — Other Ambulatory Visit: Payer: Self-pay

## 2023-10-30 ENCOUNTER — Ambulatory Visit: Payer: Self-pay | Admitting: Family Medicine

## 2023-11-08 ENCOUNTER — Other Ambulatory Visit: Payer: Self-pay

## 2023-12-07 ENCOUNTER — Other Ambulatory Visit: Payer: Self-pay

## 2023-12-17 ENCOUNTER — Other Ambulatory Visit: Payer: Self-pay

## 2023-12-17 ENCOUNTER — Encounter: Payer: Self-pay | Admitting: Family Medicine

## 2023-12-17 ENCOUNTER — Encounter (INDEPENDENT_AMBULATORY_CARE_PROVIDER_SITE_OTHER): Payer: Self-pay

## 2023-12-17 ENCOUNTER — Ambulatory Visit: Payer: Self-pay | Attending: Family Medicine | Admitting: Family Medicine

## 2023-12-17 VITALS — BP 131/92 | HR 70 | Ht 65.0 in | Wt 249.8 lb

## 2023-12-17 DIAGNOSIS — I10 Essential (primary) hypertension: Secondary | ICD-10-CM

## 2023-12-17 DIAGNOSIS — R7303 Prediabetes: Secondary | ICD-10-CM

## 2023-12-17 DIAGNOSIS — Z6841 Body Mass Index (BMI) 40.0 and over, adult: Secondary | ICD-10-CM

## 2023-12-17 LAB — POCT GLYCOSYLATED HEMOGLOBIN (HGB A1C): HbA1c, POC (controlled diabetic range): 5.6 % (ref 0.0–7.0)

## 2023-12-17 MED ORDER — CARVEDILOL 12.5 MG PO TABS
12.5000 mg | ORAL_TABLET | Freq: Two times a day (BID) | ORAL | 1 refills | Status: DC
Start: 2023-12-17 — End: 2023-12-17
  Filled 2023-12-17: qty 180, 90d supply, fill #0

## 2023-12-17 MED ORDER — CARVEDILOL 12.5 MG PO TABS
12.5000 mg | ORAL_TABLET | Freq: Two times a day (BID) | ORAL | 1 refills | Status: AC
Start: 2023-12-17 — End: ?
  Filled 2024-01-31: qty 180, 90d supply, fill #0

## 2023-12-17 MED ORDER — HYDROCHLOROTHIAZIDE 25 MG PO TABS
25.0000 mg | ORAL_TABLET | Freq: Every day | ORAL | 1 refills | Status: AC
  Filled 2024-01-31: qty 90, 90d supply, fill #0

## 2023-12-17 MED ORDER — HYDROCHLOROTHIAZIDE 25 MG PO TABS
25.0000 mg | ORAL_TABLET | Freq: Every day | ORAL | 1 refills | Status: DC
  Filled 2023-12-17: qty 90, 90d supply, fill #0

## 2023-12-17 NOTE — Progress Notes (Signed)
 Subjective:  Patient ID: Gloria Chase, female    DOB: 1991/05/23  Age: 33 y.o. MRN: [id number]  CC: Medical Management of Chronic Issues (Blood pressure/Weight loss)     Discussed the use of AI scribe software for clinical note transcription with the patient, who gave verbal consent to proceed.  History of Present Illness Gloria Chase is a 33 year old female with hypertension, morbid obesity, prediabetes, anxiety and depression who presents for follow-up on her blood pressure management.  She has been managing her blood pressure with carvedilol  12.5 mg twice daily but not taking triamterene /hydrochlorothiazide  even though it appears on her med list. She missed a dose yesterday but took her medication today. Her holistic approach to blood pressure management over the past month has been ineffective.  She is implementing lifestyle changes, including a low sodium diet and walking every other day.  There is a family history of hypertension in both parents, with her mother on multiple medications. She experienced preeclampsia after the birth of her son 12 years ago.  She experiences swelling in her left leg, which she manages with compression garments and leg elevation. The swelling is not present currently, and there is no pain. A1c is 5.6 down from 5.8 previously.  She was previously prescribed metformin  by the physician assistant for prediabetes but she is not taking this. She is also not taking her anxiety and depression medications.   Past Medical History:  Diagnosis Date   Anemia    History of colposcopy with cervical biopsy 01/11/2011   History of trichomoniasis    History of wisdom tooth extraction 2005   Hx of hernia repair 2000   Hypertension    Morbid obesity (HCC)     Past Surgical History:  Procedure Laterality Date   CERVIX LESION DESTRUCTION  12/2015    Family History  Problem Relation Age of Onset   Hypertension Mother    Hypertension Father     Hypertension Maternal Grandmother    Hypertension Maternal Grandfather    Colon cancer Maternal Grandfather     Social History   Socioeconomic History   Marital status: Single    Spouse name: Not on file   Number of children: Not on file   Years of education: Not on file   Highest education level: Not on file  Occupational History   Not on file  Tobacco Use   Smoking status: Never   Smokeless tobacco: Never  Vaping Use   Vaping status: Never Used  Substance and Sexual Activity   Alcohol use: Not Currently    Comment: social   Drug use: Yes    Types: Marijuana   Sexual activity: Yes    Partners: Male    Birth control/protection: None  Other Topics Concern   Not on file  Social History Narrative   Not on file   Social Drivers of Health   Financial Resource Strain: Not on file  Food Insecurity: Not on file  Transportation Needs: Not on file  Physical Activity: Not on file  Stress: Not on file  Social Connections: Not on file    No Known Allergies  Outpatient Medications Prior to Visit  Medication Sig Dispense Refill   hydrOXYzine  (ATARAX ) 25 MG tablet Take 1 tablet (25 mg total) by mouth at bedtime as needed. 30 tablet 3   Blood Pressure Monitoring (BLOOD PRESSURE CUFF) MISC Use to check blood pressure once daily. (Patient not taking: Reported on 12/17/2023) 1 each 0   busPIRone  (BUSPAR )  10 MG tablet Take 1 tablet (10 mg total) by mouth 2 (two) times daily. (Patient not taking: Reported on 12/17/2023) 60 tablet 3   citalopram  (CELEXA ) 10 MG tablet Take 1 tablet (10 mg total) by mouth daily. (Patient not taking: Reported on 12/17/2023) 30 tablet 3   metFORMIN  (GLUCOPHAGE ) 500 MG tablet Take 1 tablet (500 mg total) by mouth daily with breakfast. (Patient not taking: Reported on 12/17/2023) 90 tablet 1   valACYclovir  (VALTREX ) 1000 MG tablet Take 1 tablet (1,000 mg total) by mouth 2 (two) times daily. Take for 3 days, with each outbreak. (Patient not taking: Reported on  12/17/2023) 30 tablet 5   carvedilol  (COREG ) 12.5 MG tablet Take 1 tablet (12.5 mg total) by mouth 2 (two) times daily with a meal. (Patient not taking: Reported on 12/17/2023) 180 tablet 1   triamterene -hydrochlorothiazide  (DYAZIDE ) 37.5-25 MG capsule Take 1 each (1 capsule total) by mouth daily. (Patient not taking: Reported on 12/17/2023) 90 capsule 1   No facility-administered medications prior to visit.     ROS Review of Systems  Constitutional:  Negative for activity change and appetite change.  HENT:  Negative for sinus pressure and sore throat.   Respiratory:  Negative for chest tightness, shortness of breath and wheezing.   Cardiovascular:  Positive for leg swelling. Negative for chest pain and palpitations.  Gastrointestinal:  Negative for abdominal distention, abdominal pain and constipation.  Genitourinary: Negative.   Musculoskeletal: Negative.   Psychiatric/Behavioral:  Negative for behavioral problems and dysphoric mood.     Objective:  BP (!) 131/92   Pulse 70   Ht 5\' 5"  (1.651 m)   Wt 249 lb 12.8 oz (113.3 kg)   SpO2 100%   BMI 41.57 kg/m      12/17/2023    9:15 AM 12/17/2023    8:55 AM 06/28/2023   10:06 AM  BP/Weight  Systolic BP 131 155 140  Diastolic BP 92 102 99  Wt. (Lbs)  249.8   BMI  41.57 kg/m2       Physical Exam Constitutional:      Appearance: She is well-developed. She is obese.  Cardiovascular:     Rate and Rhythm: Normal rate.     Heart sounds: Normal heart sounds. No murmur heard. Pulmonary:     Effort: Pulmonary effort is normal.     Breath sounds: Normal breath sounds. No wheezing or rales.  Chest:     Chest wall: No tenderness.  Abdominal:     General: Bowel sounds are normal. There is no distension.     Palpations: Abdomen is soft. There is no mass.     Tenderness: There is no abdominal tenderness.  Musculoskeletal:        General: Normal range of motion.     Right lower leg: No edema.     Left lower leg: No edema.   Neurological:     Mental Status: She is alert and oriented to person, place, and time.  Psychiatric:        Mood and Affect: Mood normal.        Latest Ref Rng & Units 04/24/2023   10:35 AM 03/15/2022    9:47 AM 03/10/2021   10:11 AM  CMP  Glucose 70 - 99 mg/dL 87  161  94   BUN 6 - 20 mg/dL 11  14  13    Creatinine 0.57 - 1.00 mg/dL 0.96  0.45  4.09   Sodium 134 - 144 mmol/L 140  141  140   Potassium 3.5 - 5.2 mmol/L 4.4  4.7  4.4   Chloride 96 - 106 mmol/L 105  103  106   CO2 20 - 29 mmol/L  23  24   Calcium 8.7 - 10.2 mg/dL 8.4  8.5  8.6   Total Protein 6.0 - 8.5 g/dL 7.0  6.8  6.3   Total Bilirubin 0.0 - 1.2 mg/dL 0.2  0.2  <1.6   Alkaline Phos 44 - 121 IU/L 91  85  68   AST 0 - 40 IU/L 12  10  14    ALT 0 - 32 IU/L  6  5     Lipid Panel     Component Value Date/Time   CHOL 130 04/24/2023 1035   TRIG 66 04/24/2023 1035   HDL 60 04/24/2023 1035   CHOLHDL 2.2 04/24/2023 1035   LDLCALC 56 04/24/2023 1035    CBC    Component Value Date/Time   WBC 6.3 04/24/2023 1035   WBC 3.8 (L) 10/18/2019 1709   RBC 4.77 04/24/2023 1035   RBC 5.00 10/18/2019 1709   HGB 13.7 04/24/2023 1035   HCT 43.3 04/24/2023 1035   PLT 286 04/24/2023 1035   MCV 91 04/24/2023 1035   MCH 28.7 04/24/2023 1035   MCH 27.4 10/18/2019 1709   MCHC 31.6 04/24/2023 1035   MCHC 30.4 10/18/2019 1709   RDW 12.7 04/24/2023 1035   LYMPHSABS 2.7 04/24/2023 1035   MONOABS 0.3 10/18/2019 1709   EOSABS 0.1 04/24/2023 1035   BASOSABS 0.0 04/24/2023 1035    Lab Results  Component Value Date   HGBA1C 5.6 12/17/2023       1. Prediabetes (Primary) Prediabetes has been reversed with A1c of 5.6 Continue lifestyle modifications - POCT glycosylated hemoglobin (Hb A1C)  2. Essential hypertension Uncontrolled Switched from triamterene /HCTZ to just HCTZ Continue Coreg  12.5 mg twice daily Counseled on blood pressure goal of less than 130/80, low-sodium, DASH diet, medication compliance, 150 minutes of  moderate intensity exercise per week. Discussed medication compliance, adverse effects. - LP+Non-HDL Cholesterol - CMP14+EGFR - CBC with Differential/Platelet - carvedilol  (COREG ) 12.5 MG tablet; Take 1 tablet (12.5 mg total) by mouth 2 (two) times daily with a meal.  Dispense: 180 tablet; Refill: 1 - hydrochlorothiazide  (HYDRODIURIL ) 25 MG tablet; Take 1 tablet (25 mg total) by mouth daily.  Dispense: 90 tablet; Refill: 1  3. Morbid obesity (HCC) Counseled on caloric restriction, avoiding late meals - Amb Ref to Medical Weight Management  Meds ordered this encounter  Medications   carvedilol  (COREG ) 12.5 MG tablet    Sig: Take 1 tablet (12.5 mg total) by mouth 2 (two) times daily with a meal.    Dispense:  180 tablet    Refill:  1   hydrochlorothiazide  (HYDRODIURIL ) 25 MG tablet    Sig: Take 1 tablet (25 mg total) by mouth daily.    Dispense:  90 tablet    Refill:  1    Discontinue triamterene /HCTZ    Follow-up: No follow-ups on file.       Joaquin Mulberry, MD, FAAFP. [facility] [place], [place] [phone]   12/17/2023, 9:21 AM

## 2023-12-17 NOTE — Patient Instructions (Signed)
 VISIT SUMMARY:  Today, you came in for a follow-up on your blood pressure management. We discussed your current medications, lifestyle changes, and family history of hypertension. We also addressed your left leg swelling and weight management strategies.  YOUR PLAN:  -HYPERTENSION: Hypertension, or high blood pressure, is when the force of the blood against your artery walls is too high. Your blood pressure today was 155/102 mmHg, which is not well controlled. We discussed the importance of taking your medication regularly and the risks of uncontrolled hypertension. Please continue taking carvedilol  12.5 mg twice daily and triamterene  hydrochlorothiazide  been discontinued and replaced with just hydrochlorothiazide .  Keep following a low sodium diet and exercising regularly.  -LEFT LEG SWELLING: You have intermittent swelling in your left leg, which you manage with compression garments and leg elevation. This swelling may improve with your current medication, triamterene  hydrochlorothiazide . Please continue using compression and elevating your leg as needed.  -OBESITY: Obesity is when you have an excessive amount of body fat. We discussed the need to reduce your daily caloric intake by 500 calories and increase your physical activity. Walking two miles daily and using apps like My Fitness Pal to track your calories can help. You will be referred to a weight management clinic for further support.  INSTRUCTIONS:  Please follow up with the weight management clinic as arranged. We have also ordered blood tests to check your kidney function, liver function, and cholesterol levels. The results will be sent to you through [facility].

## 2023-12-18 ENCOUNTER — Ambulatory Visit: Payer: Self-pay | Admitting: Family Medicine

## 2023-12-18 LAB — LP+NON-HDL CHOLESTEROL
Cholesterol, Total: 120 mg/dL (ref 100–199)
HDL: 52 mg/dL (ref >39)
LDL Chol Calc (NIH): 53 mg/dL (ref 0–99)
Total Non-HDL-Chol (LDL+VLDL): 68 mg/dL (ref 0–129)
Triglycerides: 73 mg/dL (ref 0–149)
VLDL Cholesterol Cal: 15 mg/dL (ref 5–40)

## 2023-12-18 LAB — CMP14+EGFR
ALT: 7 IU/L (ref 0–32)
AST: 17 IU/L (ref 0–40)
Albumin: 4.2 g/dL (ref 3.9–4.9)
Alkaline Phosphatase: 80 IU/L (ref 44–121)
BUN/Creatinine Ratio: 17 (ref 9–23)
BUN: 15 mg/dL (ref 6–20)
Bilirubin Total: 0.3 mg/dL (ref 0.0–1.2)
CO2: 21 mmol/L (ref 20–29)
Calcium: 9 mg/dL (ref 8.7–10.2)
Chloride: 104 mmol/L (ref 96–106)
Creatinine, Ser: 0.88 mg/dL (ref 0.57–1.00)
Globulin, Total: 3 g/dL (ref 1.5–4.5)
Glucose: 105 mg/dL — ABNORMAL HIGH (ref 70–99)
Potassium: 4.5 mmol/L (ref 3.5–5.2)
Sodium: 140 mmol/L (ref 134–144)
Total Protein: 7.2 g/dL (ref 6.0–8.5)
eGFR: 88 mL/min/{1.73_m2} (ref >59)

## 2023-12-18 LAB — CBC WITH DIFFERENTIAL/PLATELET
Basophils Absolute: 0 10*3/uL (ref 0.0–0.2)
Basos: 1 %
EOS (ABSOLUTE): 0.1 10*3/uL (ref 0.0–0.4)
Eos: 1 %
Hematocrit: 44.8 % (ref 34.0–46.6)
Hemoglobin: 14 g/dL (ref 11.1–15.9)
Immature Grans (Abs): 0 10*3/uL (ref 0.0–0.1)
Immature Granulocytes: 0 %
Lymphocytes Absolute: 2.2 10*3/uL (ref 0.7–3.1)
Lymphs: 36 %
MCH: 28.2 pg (ref 26.6–33.0)
MCHC: 31.3 g/dL — ABNORMAL LOW (ref 31.5–35.7)
MCV: 90 fL (ref 79–97)
Monocytes Absolute: 0.5 10*3/uL (ref 0.1–0.9)
Monocytes: 9 %
Neutrophils Absolute: 3.2 10*3/uL (ref 1.4–7.0)
Neutrophils: 53 %
Platelets: 287 10*3/uL (ref 150–450)
RBC: 4.96 x10E6/uL (ref 3.77–5.28)
RDW: 12.7 % (ref 11.7–15.4)
WBC: 6 10*3/uL (ref 3.4–10.8)

## 2023-12-19 ENCOUNTER — Ambulatory Visit: Payer: Self-pay | Admitting: Obstetrics and Gynecology

## 2023-12-19 ENCOUNTER — Encounter: Payer: Self-pay | Admitting: Obstetrics and Gynecology

## 2023-12-19 ENCOUNTER — Other Ambulatory Visit (HOSPITAL_COMMUNITY)
Admission: RE | Admit: 2023-12-19 | Discharge: 2023-12-19 | Disposition: A | Discharge status: Home / Self Care | Source: Ambulatory Visit | Attending: Obstetrics | Admitting: Obstetrics

## 2023-12-19 VITALS — BP 133/84 | HR 48 | Ht 65.0 in | Wt 244.0 lb

## 2023-12-19 DIAGNOSIS — F418 Other specified anxiety disorders: Secondary | ICD-10-CM

## 2023-12-19 DIAGNOSIS — Z113 Encounter for screening for infections with a predominantly sexual mode of transmission: Secondary | ICD-10-CM

## 2023-12-19 DIAGNOSIS — Z01419 Encounter for gynecological examination (general) (routine) without abnormal findings: Secondary | ICD-10-CM | POA: Diagnosis not present

## 2023-12-19 DIAGNOSIS — Z124 Encounter for screening for malignant neoplasm of cervix: Secondary | ICD-10-CM | POA: Diagnosis present

## 2023-12-19 DIAGNOSIS — Z1339 Encounter for screening examination for other mental health and behavioral disorders: Secondary | ICD-10-CM

## 2023-12-19 NOTE — Progress Notes (Signed)
Pt presents for annual.

## 2023-12-19 NOTE — Progress Notes (Signed)
 ANNUAL EXAM Patient name: Gloria Chase MRN [id number]  Date of birth: 12/14/1990 Chief Complaint:   Annual Exam  History of Present Illness:   Gloria Chase is a 33 y.o. [phone]  female being seen today for a routine annual exam.  Current complaints: Doing well, has hx of abnormal pap needing cryo. She has a history of depression and anxiety. She endorses having several stressors in her life. She is not currently on medication and she does not desire medication at this time.  She has hypertension and is being managed by [facility] center. Had blood work done yesterday.She is taking active steps to manage her health  She has hx of recurrent bv, has been using probiotic that she reports is helping  Patient's last menstrual period was 12/13/2023 (approximate).   Gynecologic History Patient's last menstrual period was 04/04/2020. Contraception: none Last Pap: 03/15/2020. Results were: normal Last mammogram: n/a. Results were: n/a     12/19/2023    8:59 AM 06/28/2023   10:02 AM 05/10/2023   11:30 AM 03/10/2021    9:41 AM 05/12/2020    3:12 PM  Depression screen PHQ 2/9  Decreased Interest 1 1 1 3 1   Down, Depressed, Hopeless 1 1 1 1 3   PHQ - 2 Score 2 2 2 4 4   Altered sleeping 3 2 1 1 3   Tired, decreased energy 2 2 3 1 3   Change in appetite 0 1 1 0 2  Feeling bad or failure about yourself  1 1 2  0 3  Trouble concentrating 1 1 1  0 3  Moving slowly or fidgety/restless 2 0 0 0 0  Suicidal thoughts 0 0 0 0 1  PHQ-9 Score 11 9 10 6 19   Difficult doing work/chores    Not difficult at all Extremely dIfficult        12/19/2023    9:00 AM 03/10/2021    9:41 AM 05/12/2020    3:09 PM 04/09/2020    1:36 PM  GAD 7 : Generalized Anxiety Score  Nervous, Anxious, on Edge 3 3 3  0  Control/stop worrying 1 3 3  0  Worry too much - different things 1 3 3  0  Trouble relaxing 1 3 3  0  Restless 0 1 1 0  Easily annoyed or irritable 3 2 3  0  Afraid - awful might  happen 1 3 1  0  Total GAD 7 Score 10 18 17  0  Anxiety Difficulty  Not difficult at all Extremely difficult      Review of Systems:   Pertinent items are noted in HPI Denies any headaches, blurred vision, fatigue, shortness of breath, chest pain, abdominal pain, abnormal vaginal discharge/itching/odor/irritation, problems with periods, bowel movements, urination, or intercourse unless otherwise stated above. Pertinent History Reviewed:  Reviewed past medical,surgical, social and family history.  Reviewed problem list, medications and allergies. Physical Assessment:   Vitals:   12/19/23 0819 12/19/23 0850  BP: (!) 144/100 133/84  Pulse: 66 (!) 48  Weight: 244 lb (110.7 kg)   Height: 5\' 5"  (1.651 m)   Body mass index is 40.6 kg/m.        Physical Examination:   General appearance - well appearing, and in no distress  Mental status - alert, oriented   Psych:  She has a normal mood and affect  Skin - warm and dry, normal color  Chest - effort normal, all lung fields clear to auscultation bilaterally  Heart - normal rate and regular  rhythm  Neck:  midline trachea, no thyromegaly or nodules  Breasts - breasts appear normal, no suspicious masses, no skin or nipple changes or  axillary nodes  Abdomen - soft, nontender, nondistended  Pelvic - VULVA: normal appearing vulva with no masses, tenderness or lesions  VAGINA: normal appearing vagina with normal color and discharge, no lesions  CERVIX: normal appearing cervix without discharge or lesions, no CMT  Thin prep pap is done w HR HPV cotesting  UTERUS: uterus is felt to be normal size, shape, consistency and nontender   ADNEXA: No adnexal masses or tenderness noted.  Extremities:  No swelling or varicosities noted  Chaperone present for exam  No results found for this or any previous visit (from the past 24 hours).  Assessment & Plan:  1. Encounter for annual routine gynecological examination (Primary) Pap today Annual blood work  with PCP Encourage self breast exams  2. Cervical cancer screening  - Cytology - PAP( [place])  3. Screen for STD (sexually transmitted disease)  - Cervicovaginal ancillary only( [place]) - HIV antibody (with reflex) - RPR - Hepatitis C Antibody - Hepatitis B Surface AntiGEN  4. Depression with anxiety Discussed option for counseling, she is interested in referral  - Ambulatory referral to Integrated [facility]   Labs/procedures today:   Mammogram: @ 35yo, or sooner if problems Colonoscopy: @ 35yo, or sooner if problems  Orders Placed This Encounter  Procedures   HIV antibody (with reflex)   RPR   Hepatitis C Antibody   Hepatitis B Surface AntiGEN   Ambulatory referral to Integrated Behavioral Health    Meds: No orders of the defined types were placed in this encounter.   Follow-up: Return in about 1 year (around 12/18/2024) for Zoanne Hinders, FNP

## 2023-12-20 ENCOUNTER — Ambulatory Visit: Payer: Self-pay | Admitting: Obstetrics and Gynecology

## 2023-12-20 DIAGNOSIS — B9689 Other specified bacterial agents as the cause of diseases classified elsewhere: Secondary | ICD-10-CM

## 2023-12-20 LAB — CERVICOVAGINAL ANCILLARY ONLY
Bacterial Vaginitis (gardnerella): POSITIVE — AB
Candida Glabrata: NEGATIVE
Candida Vaginitis: NEGATIVE
Chlamydia: NEGATIVE
Comment: NEGATIVE
Comment: NEGATIVE
Comment: NEGATIVE
Comment: NEGATIVE
Comment: NEGATIVE
Comment: NORMAL
Neisseria Gonorrhea: NEGATIVE
Trichomonas: NEGATIVE

## 2023-12-20 LAB — RPR: RPR Ser Ql: NONREACTIVE

## 2023-12-20 LAB — HEPATITIS C ANTIBODY: Hep C Virus Ab: NONREACTIVE

## 2023-12-20 LAB — HEPATITIS B SURFACE ANTIGEN: Hepatitis B Surface Ag: NEGATIVE

## 2023-12-20 LAB — HIV ANTIBODY (ROUTINE TESTING W REFLEX): HIV Screen 4th Generation wRfx: NONREACTIVE

## 2023-12-25 ENCOUNTER — Other Ambulatory Visit: Payer: Self-pay

## 2023-12-25 ENCOUNTER — Ambulatory Visit: Payer: Self-pay | Admitting: Family Medicine

## 2023-12-25 ENCOUNTER — Telehealth: Payer: Self-pay | Admitting: Obstetrics and Gynecology

## 2023-12-25 MED ORDER — METRONIDAZOLE 0.75 % VA GEL
1.0000 | Freq: Every day | VAGINAL | 2 refills | Status: AC
  Filled 2023-12-25: qty 70, 7d supply, fill #0

## 2023-12-25 NOTE — Telephone Encounter (Signed)
 Discussed recurrent bv, will plan to come in for mycoplasma/ureaplasma swab. metrogel  tx and maintenence sent to pharmacy

## 2023-12-25 NOTE — Telephone Encounter (Signed)
 Patient wants to know if she can also have Vit-D and estriol checked as well. And any other labs that would help figure out why she is getting BV

## 2023-12-26 ENCOUNTER — Other Ambulatory Visit: Payer: Self-pay

## 2023-12-26 ENCOUNTER — Encounter: Payer: Self-pay | Admitting: Family Medicine

## 2023-12-26 ENCOUNTER — Telehealth (HOSPITAL_BASED_OUTPATIENT_CLINIC_OR_DEPARTMENT_OTHER): Admitting: Family Medicine

## 2023-12-26 DIAGNOSIS — F411 Generalized anxiety disorder: Secondary | ICD-10-CM

## 2023-12-26 DIAGNOSIS — F439 Reaction to severe stress, unspecified: Secondary | ICD-10-CM

## 2023-12-26 DIAGNOSIS — R002 Palpitations: Secondary | ICD-10-CM

## 2023-12-26 DIAGNOSIS — F5104 Psychophysiologic insomnia: Secondary | ICD-10-CM

## 2023-12-26 MED ORDER — FLUOXETINE HCL 20 MG PO CAPS
20.0000 mg | ORAL_CAPSULE | Freq: Every day | ORAL | 3 refills | Status: AC
  Filled 2023-12-26: qty 30, 30d supply, fill #0

## 2023-12-26 MED ORDER — HYDROXYZINE HCL 25 MG PO TABS
25.0000 mg | ORAL_TABLET | Freq: Three times a day (TID) | ORAL | 3 refills | Status: AC | PRN
  Filled 2023-12-26: qty 60, 20d supply, fill #0

## 2023-12-26 NOTE — Progress Notes (Signed)
 Virtual Visit via Video Note  I connected with Gloria Chase, on 12/26/2023 at 1:32 PM by video enabled telemedicine device and verified that I am speaking with the correct person using two identifiers.   Consent: I discussed the limitations, risks, security and privacy concerns of performing an evaluation and management service by telemedicine and the availability of in person appointments. I also discussed with the patient that there may be a patient responsible charge related to this service. The patient expressed understanding and agreed to proceed.   Location of Patient: Home  Location of Provider: Clinic   Persons participating in Telemedicine visit: DINARA LUPU Dr. Adan Holms    Discussed the use of AI scribe software for clinical note transcription with the patient, who gave verbal consent to proceed.  History of Present Illness Gloria Chase is a 33 year old female with hypertension, morbid obesity, prediabetes, anxiety and depression who presents with palpitations and concerns about anxiety and stress.  She experiences palpitations at rest, lasting all day, occurring two to three times a week, accompanied by chest pain and shortness of breath. She recalls an irregular EKG in October but is unsure of the details. Her blood pressure was previously elevated due to being out of medication but she has since resumed taking her medications regularly. Review of EKG revealed presence of PACs but otherwise normal.  She has anxiety and depression, previously treated with Buspar  and hydroxyzine , which she stopped due to ineffectiveness. Despite discontinuation, anxiety symptoms persist. She currently takes blood pressure medication and uses turmeric and cayenne pepper supplements. She is concerned about anxiety contributing to her palpitations.  Her mother has thyroid  issues, raising concerns about the impact of family history on her symptoms.  She works from home,  standing about 50% of the time to manage symptoms. She experiences significant stress, primarily related to her 69 year old daughter, contributing to 70% of her stress. She has not pursued counseling or therapy, despite having access through her job and insurance.      Past Medical History:  Diagnosis Date   Anemia    History of colposcopy with cervical biopsy 01/11/2011   History of trichomoniasis    History of wisdom tooth extraction 2005   Hx of hernia repair 2000   Hypertension    Morbid obesity (HCC)    No Known Allergies  Current Outpatient Medications on File Prior to Visit  Medication Sig Dispense Refill   carvedilol  (COREG ) 12.5 MG tablet Take 1 tablet (12.5 mg total) by mouth 2 (two) times daily with a meal. 180 tablet 1   hydrochlorothiazide  (HYDRODIURIL ) 25 MG tablet Take 1 tablet (25 mg total) by mouth daily. 90 tablet 1   hydrOXYzine  (ATARAX ) 25 MG tablet Take 1 tablet (25 mg total) by mouth at bedtime as needed. (Patient not taking: Reported on 12/19/2023) 30 tablet 3   metroNIDAZOLE  (METROGEL ) 0.75 % vaginal gel Apply one applicatorful vaginally at bedtime for 5 days.Then twice weekly for 4 months 70 g 2   valACYclovir  (VALTREX ) 1000 MG tablet Take 1 tablet (1,000 mg total) by mouth 2 (two) times daily. Take for 3 days, with each outbreak. (Patient not taking: Reported on 12/19/2023) 30 tablet 5   No current facility-administered medications on file prior to visit.    ROS: See HPI  Observations/Objective: Awake, alert, oriented x3 Not in acute distress Normal mood      Latest Ref Rng & Units 12/17/2023    9:39 AM 04/24/2023   10:35 AM  03/15/2022    9:47 AM  CMP  Glucose 70 - 99 mg/dL 098  87  119   BUN 6 - 20 mg/dL 15  11  14    Creatinine 0.57 - 1.00 mg/dL 1.47  8.29  5.62   Sodium 134 - 144 mmol/L 140  140  141   Potassium 3.5 - 5.2 mmol/L 4.5  4.4  4.7   Chloride 96 - 106 mmol/L 104  105  103   CO2 20 - 29 mmol/L 21   23   Calcium 8.7 - 10.2 mg/dL 9.0  8.4   8.5   Total Protein 6.0 - 8.5 g/dL 7.2  7.0  6.8   Total Bilirubin 0.0 - 1.2 mg/dL 0.3  0.2  0.2   Alkaline Phos 44 - 121 IU/L 80  91  85   AST 0 - 40 IU/L 17  12  10    ALT 0 - 32 IU/L 7   6     Lipid Panel     Component Value Date/Time   CHOL 120 12/17/2023 0939   TRIG 73 12/17/2023 0939   HDL 52 12/17/2023 0939   CHOLHDL 2.2 04/24/2023 1035   LDLCALC 53 12/17/2023 0939   LABVLDL 15 12/17/2023 0939    Lab Results  Component Value Date   HGBA1C 5.6 12/17/2023   Lab Results  Component Value Date   TSH 0.809 03/15/2022     Assessment and plan: 1. GAD (generalized anxiety disorder) (Primary) Uncontrolled Stressors include 62 year old daughter Initiate SSRI and continue hydroxyzine  as needed - FLUoxetine (PROZAC) 20 MG capsule; Take 1 capsule (20 mg total) by mouth daily.  Dispense: 30 capsule; Refill: 3 - hydrOXYzine  (ATARAX ) 25 MG tablet; Take 1 tablet (25 mg total) by mouth every 8 (eight) hours as needed.  Dispense: 60 tablet; Refill: 3  2. Palpitations Previous EKG negative for cardiac etiology - T4, free; Future - TSH; Future - T3; Future  3. Stress Advise she would benefit from counseling She will be reaching out to counseling services through her job - hydrOXYzine  (ATARAX ) 25 MG tablet; Take 1 tablet (25 mg total) by mouth every 8 (eight) hours as needed.  Dispense: 60 tablet; Refill: 3  4. Psychophysiological insomnia - hydrOXYzine  (ATARAX ) 25 MG tablet; Take 1 tablet (25 mg total) by mouth every 8 (eight) hours as needed.  Dispense: 60 tablet; Refill: 3  There are no diagnoses linked to this encounter.   No orders of the defined types were placed in this encounter.   Follow Up Instructions: Keep previously scheduled appointment   I discussed the assessment and treatment plan with the patient. The patient was provided an opportunity to ask questions and all were answered. The patient agreed with the plan and demonstrated an understanding of the  instructions.   The patient was advised to call back or seek an in-person evaluation if the symptoms worsen or if the condition fails to improve as anticipated.     I provided 17 minutes total of Telehealth time during this encounter including median intraservice time, reviewing previous notes, investigations, ordering medications, medical decision making, coordinating care and patient verbalized understanding at the end of the visit.     Joaquin Mulberry, MD, FAAFP. [facility] [place], [place] [phone]   12/26/2023, 1:32 PM

## 2023-12-26 NOTE — Patient Instructions (Signed)
 VISIT SUMMARY:  During your visit, we discussed your palpitations, anxiety, and hypertension. We reviewed your symptoms, including chest pain and shortness of breath, and considered the impact of stress and family history on your health. We also discussed your current medications and supplements, and the importance of managing your stress and anxiety.  YOUR PLAN:  -PALPITATIONS: Palpitations are feelings of having a fast-beating, fluttering, or pounding heart. They can be caused by stress, anxiety, or other medical conditions. We will order a thyroid  function test to rule out thyroid  issues and restart Prozac 20 mg daily to help manage your anxiety. You should also take hydroxyzine  25 mg every 8 hours as needed for anxiety. Please track your palpitations and any potential triggers.  -ANXIETY: Anxiety is a feeling of worry, nervousness, or unease. It can contribute to physical symptoms like palpitations. We recommend restarting Prozac 20 mg daily and taking hydroxyzine  25 mg every 8 hours as needed. Additionally, we encourage you to seek counseling or therapy through your work resources or provided referrals.  -ESSENTIAL HYPERTENSION: Hypertension, or high blood pressure, is a condition where the force of the blood against your artery walls is too high. It is managed with medication and lifestyle changes. Continue taking Carvedilol  12.5 mg twice daily with meals.  -GENERAL HEALTH MAINTENANCE: We discussed the importance of managing your stress and anxiety through lifestyle changes. This includes standing more during work and using your work resources for Optician, dispensing and therapy.  INSTRUCTIONS:  Please follow up with the thyroid  function test as ordered. Restart Prozac 20 mg daily and take hydroxyzine  25 mg every 8 hours as needed for anxiety. Continue taking Carvedilol  12.5 mg twice daily with meals. Track your palpitations and any potential triggers. Seek counseling or therapy through your work  resources or provided referrals.

## 2024-01-01 NOTE — Congregational Nurse Program (Signed)
 Met with this pleasant young lady who is here to check in.  Her two children are not with her but will be back later.Client reports being evicted from where she was living, has moved several times within the past year.  Client has a history of HBP and takes Carvedilol  regularly and has medication on her.She was also pre-diabetic at one time but through diet and exercise she has been able to get her A!C within normal limit now.  Client works virtually 9:00 a.m to 6:00 p.m., uses public transportation. Corlis Dienes 09 [birth year] 2008 and Amir D. Hofstra 08 04 2012 are her two children. Amir doesn't tolerate acidic fruits, no other medical or mental health needs. Plans:  Nurse checked B/P 112/84; BS Fasting 92 for Doralee. 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 She does not have a B/P monitor.  Nurse will provide her one if she needs it.  Client had to cut visit short as she had to pick up her children.  Nurse will follow up next week.  Claudy Abdallah D. Hansel Ley MSN, RN CongregationalNurse [employer] [phone]

## 2024-01-03 ENCOUNTER — Ambulatory Visit

## 2024-01-03 ENCOUNTER — Other Ambulatory Visit: Payer: Self-pay

## 2024-01-03 ENCOUNTER — Ambulatory Visit: Attending: Family Medicine

## 2024-01-03 DIAGNOSIS — R002 Palpitations: Secondary | ICD-10-CM

## 2024-01-04 ENCOUNTER — Ambulatory Visit: Payer: Self-pay | Admitting: Family Medicine

## 2024-01-04 LAB — TSH: TSH: 0.52 u[IU]/mL (ref 0.450–4.500)

## 2024-01-04 LAB — T4, FREE: Free T4: 1.42 ng/dL (ref 0.82–1.77)

## 2024-01-04 LAB — T3: T3, Total: 116 ng/dL (ref 71–180)

## 2024-01-14 ENCOUNTER — Encounter (INDEPENDENT_AMBULATORY_CARE_PROVIDER_SITE_OTHER): Payer: Self-pay

## 2024-01-31 ENCOUNTER — Other Ambulatory Visit: Payer: Self-pay

## 2024-02-05 ENCOUNTER — Other Ambulatory Visit: Payer: Self-pay

## 2024-02-26 ENCOUNTER — Institutional Professional Consult (permissible substitution) (INDEPENDENT_AMBULATORY_CARE_PROVIDER_SITE_OTHER): Admitting: Nurse Practitioner

## 2024-03-18 ENCOUNTER — Institutional Professional Consult (permissible substitution) (INDEPENDENT_AMBULATORY_CARE_PROVIDER_SITE_OTHER): Admitting: Nurse Practitioner
